# Patient Record
Sex: Male | Born: 1965 | Race: White | Hispanic: No | Smoking: Current every day smoker
Health system: Southern US, Community
[De-identification: ages and names within clinical notes are randomized; demographics above are authoritative.]

## PROBLEM LIST (undated history)

## (undated) DIAGNOSIS — K589 Irritable bowel syndrome without diarrhea: Secondary | ICD-10-CM

## (undated) DIAGNOSIS — K52839 Microscopic colitis, unspecified: Secondary | ICD-10-CM

## (undated) HISTORY — PX: SIGMOIDOSCOPY: SUR1295

## (undated) HISTORY — DX: Microscopic colitis, unspecified: K52.839

## (undated) HISTORY — PX: MOUTH SURGERY: SHX715

## (undated) HISTORY — DX: Irritable bowel syndrome, unspecified: K58.9

---

## 2000-12-06 ENCOUNTER — Ambulatory Visit (HOSPITAL_COMMUNITY): Admission: RE | Admit: 2000-12-06 | Discharge: 2000-12-06 | Payer: Self-pay | Admitting: Orthopaedic Surgery

## 2001-06-03 ENCOUNTER — Emergency Department (HOSPITAL_COMMUNITY): Admission: EM | Admit: 2001-06-03 | Discharge: 2001-06-03 | Payer: Self-pay | Admitting: Emergency Medicine

## 2001-06-04 ENCOUNTER — Encounter: Payer: Self-pay | Admitting: Emergency Medicine

## 2001-06-04 ENCOUNTER — Emergency Department (HOSPITAL_COMMUNITY): Admission: EM | Admit: 2001-06-04 | Discharge: 2001-06-04 | Payer: Self-pay | Admitting: Emergency Medicine

## 2001-06-25 ENCOUNTER — Inpatient Hospital Stay (HOSPITAL_COMMUNITY): Admission: EM | Admit: 2001-06-25 | Discharge: 2001-07-03 | Payer: Self-pay | Admitting: *Deleted

## 2003-07-27 ENCOUNTER — Emergency Department (HOSPITAL_COMMUNITY): Admission: EM | Admit: 2003-07-27 | Discharge: 2003-07-28 | Payer: Self-pay | Admitting: Emergency Medicine

## 2003-08-16 ENCOUNTER — Emergency Department (HOSPITAL_COMMUNITY): Admission: EM | Admit: 2003-08-16 | Discharge: 2003-08-16 | Payer: Self-pay | Admitting: Emergency Medicine

## 2003-08-23 ENCOUNTER — Emergency Department (HOSPITAL_COMMUNITY): Admission: EM | Admit: 2003-08-23 | Discharge: 2003-08-23 | Payer: Self-pay | Admitting: Emergency Medicine

## 2003-09-11 ENCOUNTER — Emergency Department (HOSPITAL_COMMUNITY): Admission: EM | Admit: 2003-09-11 | Discharge: 2003-09-11 | Payer: Self-pay | Admitting: Emergency Medicine

## 2003-09-16 ENCOUNTER — Emergency Department (HOSPITAL_COMMUNITY): Admission: EM | Admit: 2003-09-16 | Discharge: 2003-09-16 | Payer: Self-pay | Admitting: Emergency Medicine

## 2003-09-30 ENCOUNTER — Emergency Department (HOSPITAL_COMMUNITY): Admission: EM | Admit: 2003-09-30 | Discharge: 2003-09-30 | Payer: Self-pay | Admitting: Emergency Medicine

## 2003-10-01 ENCOUNTER — Emergency Department (HOSPITAL_COMMUNITY): Admission: EM | Admit: 2003-10-01 | Discharge: 2003-10-01 | Payer: Self-pay | Admitting: Emergency Medicine

## 2004-12-20 ENCOUNTER — Emergency Department (HOSPITAL_COMMUNITY): Admission: EM | Admit: 2004-12-20 | Discharge: 2004-12-20 | Payer: Self-pay | Admitting: Emergency Medicine

## 2004-12-25 ENCOUNTER — Emergency Department (HOSPITAL_COMMUNITY): Admission: EM | Admit: 2004-12-25 | Discharge: 2004-12-25 | Payer: Self-pay | Admitting: Emergency Medicine

## 2005-01-01 ENCOUNTER — Emergency Department (HOSPITAL_COMMUNITY): Admission: EM | Admit: 2005-01-01 | Discharge: 2005-01-01 | Payer: Self-pay | Admitting: Emergency Medicine

## 2005-03-20 ENCOUNTER — Emergency Department (HOSPITAL_COMMUNITY): Admission: EM | Admit: 2005-03-20 | Discharge: 2005-03-20 | Payer: Self-pay | Admitting: Emergency Medicine

## 2005-04-08 ENCOUNTER — Emergency Department (HOSPITAL_COMMUNITY): Admission: EM | Admit: 2005-04-08 | Discharge: 2005-04-08 | Payer: Self-pay | Admitting: Family Medicine

## 2005-07-09 ENCOUNTER — Emergency Department (HOSPITAL_COMMUNITY): Admission: EM | Admit: 2005-07-09 | Discharge: 2005-07-09 | Payer: Self-pay | Admitting: Family Medicine

## 2005-08-11 ENCOUNTER — Emergency Department (HOSPITAL_COMMUNITY): Admission: EM | Admit: 2005-08-11 | Discharge: 2005-08-11 | Payer: Self-pay | Admitting: Family Medicine

## 2005-08-12 ENCOUNTER — Emergency Department (HOSPITAL_COMMUNITY): Admission: EM | Admit: 2005-08-12 | Discharge: 2005-08-13 | Payer: Self-pay | Admitting: Emergency Medicine

## 2005-08-17 ENCOUNTER — Emergency Department (HOSPITAL_COMMUNITY): Admission: EM | Admit: 2005-08-17 | Discharge: 2005-08-17 | Payer: Self-pay | Admitting: Emergency Medicine

## 2005-09-26 ENCOUNTER — Emergency Department (HOSPITAL_COMMUNITY): Admission: EM | Admit: 2005-09-26 | Discharge: 2005-09-26 | Payer: Self-pay | Admitting: Emergency Medicine

## 2005-11-15 ENCOUNTER — Emergency Department (HOSPITAL_COMMUNITY): Admission: EM | Admit: 2005-11-15 | Discharge: 2005-11-15 | Payer: Self-pay | Admitting: Family Medicine

## 2005-12-18 ENCOUNTER — Emergency Department (HOSPITAL_COMMUNITY): Admission: EM | Admit: 2005-12-18 | Discharge: 2005-12-18 | Payer: Self-pay | Admitting: Family Medicine

## 2006-01-17 ENCOUNTER — Emergency Department (HOSPITAL_COMMUNITY): Admission: EM | Admit: 2006-01-17 | Discharge: 2006-01-17 | Payer: Self-pay | Admitting: Emergency Medicine

## 2006-01-22 ENCOUNTER — Emergency Department (HOSPITAL_COMMUNITY): Admission: EM | Admit: 2006-01-22 | Discharge: 2006-01-22 | Payer: Self-pay | Admitting: Emergency Medicine

## 2006-02-12 ENCOUNTER — Emergency Department (HOSPITAL_COMMUNITY): Admission: EM | Admit: 2006-02-12 | Discharge: 2006-02-12 | Payer: Self-pay | Admitting: Emergency Medicine

## 2006-02-14 ENCOUNTER — Emergency Department (HOSPITAL_COMMUNITY): Admission: EM | Admit: 2006-02-14 | Discharge: 2006-02-14 | Payer: Self-pay | Admitting: Emergency Medicine

## 2006-02-18 ENCOUNTER — Inpatient Hospital Stay (HOSPITAL_COMMUNITY): Admission: AD | Admit: 2006-02-18 | Discharge: 2006-02-20 | Payer: Self-pay | Admitting: Psychiatry

## 2006-02-18 ENCOUNTER — Emergency Department (HOSPITAL_COMMUNITY): Admission: EM | Admit: 2006-02-18 | Discharge: 2006-02-18 | Payer: Self-pay | Admitting: Emergency Medicine

## 2006-02-18 ENCOUNTER — Ambulatory Visit: Payer: Self-pay | Admitting: Psychiatry

## 2006-02-20 ENCOUNTER — Emergency Department (HOSPITAL_COMMUNITY): Admission: EM | Admit: 2006-02-20 | Discharge: 2006-02-20 | Payer: Self-pay | Admitting: Emergency Medicine

## 2006-02-20 ENCOUNTER — Inpatient Hospital Stay (HOSPITAL_COMMUNITY): Admission: EM | Admit: 2006-02-20 | Discharge: 2006-02-21 | Payer: Self-pay | Admitting: *Deleted

## 2006-02-23 ENCOUNTER — Emergency Department (HOSPITAL_COMMUNITY): Admission: EM | Admit: 2006-02-23 | Discharge: 2006-02-23 | Payer: Self-pay | Admitting: Emergency Medicine

## 2006-02-24 ENCOUNTER — Other Ambulatory Visit (HOSPITAL_COMMUNITY): Admission: RE | Admit: 2006-02-24 | Discharge: 2006-05-25 | Payer: Self-pay | Admitting: Psychiatry

## 2006-04-09 ENCOUNTER — Emergency Department (HOSPITAL_COMMUNITY): Admission: EM | Admit: 2006-04-09 | Discharge: 2006-04-09 | Payer: Self-pay | Admitting: Emergency Medicine

## 2006-04-15 ENCOUNTER — Emergency Department (HOSPITAL_COMMUNITY): Admission: EM | Admit: 2006-04-15 | Discharge: 2006-04-15 | Payer: Self-pay | Admitting: Emergency Medicine

## 2006-04-24 ENCOUNTER — Emergency Department (HOSPITAL_COMMUNITY): Admission: EM | Admit: 2006-04-24 | Discharge: 2006-04-24 | Payer: Self-pay | Admitting: *Deleted

## 2006-06-09 ENCOUNTER — Emergency Department (HOSPITAL_COMMUNITY): Admission: EM | Admit: 2006-06-09 | Discharge: 2006-06-09 | Payer: Self-pay | Admitting: Family Medicine

## 2006-10-24 ENCOUNTER — Emergency Department (HOSPITAL_COMMUNITY): Admission: EM | Admit: 2006-10-24 | Discharge: 2006-10-24 | Payer: Self-pay | Admitting: Emergency Medicine

## 2006-11-21 ENCOUNTER — Emergency Department (HOSPITAL_COMMUNITY): Admission: EM | Admit: 2006-11-21 | Discharge: 2006-11-21 | Payer: Self-pay | Admitting: Emergency Medicine

## 2007-12-13 ENCOUNTER — Emergency Department (HOSPITAL_COMMUNITY): Admission: EM | Admit: 2007-12-13 | Discharge: 2007-12-13 | Payer: Self-pay | Admitting: Emergency Medicine

## 2008-02-07 ENCOUNTER — Emergency Department (HOSPITAL_COMMUNITY): Admission: EM | Admit: 2008-02-07 | Discharge: 2008-02-07 | Payer: Self-pay | Admitting: Emergency Medicine

## 2008-03-02 ENCOUNTER — Emergency Department (HOSPITAL_COMMUNITY): Admission: EM | Admit: 2008-03-02 | Discharge: 2008-03-02 | Payer: Self-pay | Admitting: Emergency Medicine

## 2008-04-09 ENCOUNTER — Emergency Department (HOSPITAL_COMMUNITY): Admission: EM | Admit: 2008-04-09 | Discharge: 2008-04-09 | Payer: Self-pay | Admitting: Emergency Medicine

## 2009-12-02 ENCOUNTER — Emergency Department (HOSPITAL_COMMUNITY)
Admission: EM | Admit: 2009-12-02 | Discharge: 2009-12-02 | Payer: Self-pay | Source: Home / Self Care | Admitting: Emergency Medicine

## 2010-05-11 LAB — HIV ANTIBODY (ROUTINE TESTING W REFLEX): HIV: NONREACTIVE

## 2010-05-12 LAB — BASIC METABOLIC PANEL
BUN: 7 mg/dL (ref 6–23)
CO2: 25 mEq/L (ref 19–32)
Chloride: 108 mEq/L (ref 96–112)
GFR calc Af Amer: >60 mL/min (ref >60)
GFR calc non Af Amer: >60 mL/min (ref >60)
Glucose, Bld: 102 mg/dL — ABNORMAL HIGH (ref 70–99)
Potassium: 4.5 mEq/L (ref 3.5–5.1)
Sodium: 140 mEq/L (ref 135–145)

## 2010-05-12 LAB — HEMOCCULT GUIAC POC 1CARD (OFFICE): Fecal Occult Bld: NEGATIVE

## 2010-05-12 LAB — CBC
MCV: 96.1 fL (ref 78.0–100.0)
Platelets: 308 10*3/uL (ref 150–400)

## 2010-05-12 LAB — DIFFERENTIAL
Monocytes Absolute: 0.5 10*3/uL (ref 0.1–1.0)
Monocytes Relative: 8 % (ref 3–12)
Neutro Abs: 4 10*3/uL (ref 1.7–7.7)

## 2010-06-12 NOTE — Discharge Summary (Signed)
[facility]  Patient:    Marvin [name], Marvin [name] Visit Number: [id number] MRN: [id number]          Service Type: PSY Location: [id number] [id number] Attending Physician:  Rachael Fee Dictated by:   Reymundo Poll Dub Mikes, M.D. Admit Date:  06/25/2001 Discharge Date: 07/03/2001                             Discharge Summary  CHIEF COMPLAINT AND PRESENTING ILLNESS:  This was the first admission to [facility] for this 45 year old male, voluntarily admitted for opiate detox.  History of Vicodin dependence, using Vicodin for the past 3-1/2 years, taking up to 30 a day for a year.  Reports this time he ran out of options.  Says he has been using as many as 10 doctors at a time to obtain prescriptions.  Reports he initially started Vicodin for a back injury, reports a herniated disk.  He states he is highly motivated and states he will attend NA meetings after discharge.  His sleep has been fair, his appetite has been decreased with a 10-15 pound weight loss.  His last opiate use was Friday.  Reports nausea, poor sleep, and anxiety.  Drank over the weekend when he ran out of his medication.  Denied any psychosis, suicidal or homicidal ideas.  PAST PSYCHIATRIC HISTORY:  No previous treatment.  ALCOHOL AND DRUG HISTORY:  History of alcohol abuse, sober for 1-1/2 years until he relapsed this past weekend.  Has been using up to 30 Vicodin per day.  MEDICAL HISTORY:  Noncontributory.  PHYSICAL EXAMINATION:  Performed at [facility], no any acute findings.  MENTAL STATUS EXAMINATION:  Reveals a well-nourished, well-developed, alert, cooperative male, casually dressed.  Speech is clear, mood is anxious, affect is mildly anxious, thought processes are coherent, no evidence of psychosis, no auditory or visual hallucinations, no suicidal or homicidal ideas. Cognition well preserved.  ADMITTING  DIAGNOSES: Axis I:    1. Opiate dependence.            2. Anxiety  disorder not otherwise specified. Axis II:   No diagnosis. Axis III:  No diagnosis. Axis IV:   Moderate. Axis V:    Global assessment of function upon admission 30-35, highest            global assessment of function in past year 65-70.  COURSE IN THE HOSPITAL:  He was admitted and started on intensive individual and group psychotherapy.  We used the [employer] for detox.  He complained of extreme anxiety as well as depression.  He complained of no energy, no motivation, feeling completely overwhelmed.  He was given some Seroquel as well as some Vistaril.  He was started on Effexor 37.5 that was increased up to twice a day, and he was given Provigil.  Continued to experience a lot of anxiety, was using a lot of Seroquel, was medication seeking, complained of discomfort.  Anxiety, agitation, and afraid of what was going to happen, of going crazy, and the cramping, wanting some help with cramps and he was given Flexeril successfully.  Was using up to Seroquel 200 4 times a day to get him calm.  As he improved, he was less med seeking.  The Effexor was increased. We worked on Pharmacologist and relapse prevention.  He admitted that his appetite was starting to come back, very anxious, upset, he felt agitated, especially in the morning, but able  to use his coping skills.  The staff worked particularly hard to help him deal with coping skills, how to suit himself.  Plan was for him to be discharged, but by June 7 he felt he was not ready.  When he continued to be detoxed, the Seroquel dose was too high, so we went ahead and started decreasing the Seroquel.  By June 9, he was much improved.  He felt he was ready to go, had gained some weight, fully detoxed, mood euthymic, affect was broad.  No suicidal ideas, no homicidal ideas.  He was motivated to pursue treatment through the [facility].  DISCHARGE  DIAGNOSES: Axis I:    1. Opiate dependence.            2. Anxiety disorder not otherwise  specified.            3. Depressive disorder not otherwise specified. Axis II:   No diagnosis. Axis III:  No diagnosis. Axis IV:   Moderate. Axis V:    Global assessment of function upon discharge 60.  DISCHARGE MEDICATIONS: 1. Seroquel 100 1 every 6-8 hours as needed for anxiety. 2. Effexor XR 112.5 mg daily.  DISPOSITION:  [facility] starting the following day. Dictated by:   Reymundo Poll Dub Mikes, M.D. Attending Physician:  Rachael Fee DD:  08/02/01 TD:  08/05/01 Job: [id number]

## 2010-06-12 NOTE — Discharge Summary (Signed)
NAME:  Marvin [name], Marvin [name] NO.:  [id number]   MEDICAL RECORD NO.:  [id number]           PATIENT TYPE:   LOCATION:                                 FACILITY:   PHYSICIAN:  Geoffery Lyons, M.D.           DATE OF BIRTH:   DATE OF ADMISSION:  02/20/2006  DATE OF DISCHARGE:  02/21/2006                               DISCHARGE SUMMARY   IDENTIFYING INFORMATION:  This is a 45 year old white male who is  married.  This is a voluntary admission.   HISTORY OF PRESENT ILLNESS:  This patient was initially admitted to  [facility] on January 25 after presenting in  the emergency room; feeling anxious after running out of his Percocet  that he takes for back pain and he had started drinking.  His urine drug  screen at that time was positive for opiates and the alcohol level was  less than 5.  He was admitted to the inpatient unit and then in about 24  hours signed papers discharging himself AMA which he states was because  he wanted to go out and smoke which is not currently allowed on our unit  and he subsequently presented in the emergency room requesting admission  again and was admitted on the evening of the 27th at about 11 o'clock.  This morning, now on the 28th, he states that he had started drinking  after running out of his opiates on Saturday; was drinking to try to  stay calm.  He denies that he is dependent on alcohol; wants to pursue  sobriety as an outpatient and is requesting to attend the chemical  dependency outpatient clinic down stairs and denies any suicidal or  homicidal thought.  He is currently married, living at home with his  wife in a stable relationship.   PAST PSYCHIATRIC HISTORY:  Third inpatient psychiatric admission with  his prior stay being June 1 to July 03, 2001, at which time he was  treated for depression and opiate dependence.  Denies any prior suicide  attempts.  Has a history of abusing Vicodin in the past.  Has  admitted  using multiple physicians to obtain prescriptions.  In the past, used up  to 30 Vicodin daily.   SOCIAL HISTORY:  A history of alcohol abuse.  The patient had been sober  for about 1-1/2 years at his longest period of duration.  He has one  child and has a basic education.  No current legal problems.   CURRENT MEDICAL PROBLEMS:  None.  The patient does have some history of  a chronic back pain in the past which is how he started the use of  opiates.  No current medical problems.   DRUG ALLERGIES:  AMPICILLIN WHICH CAUSES A RASH.   CURRENT MEDICATIONS:  No current medications prescribed.   PHYSICAL FINDINGS:  Full physical exam was done in the emergency room,  noted in the record and is completely unremarkable.  Patient has a  normal neuro and motor exam today.  Vital signs on admission were all  within normal  limits.   DIAGNOSTIC STUDIES:  WBC 6.8, hemoglobin 13.5, hematocrit 39.7,  platelets 396,000, MCV 98.3.  Urine drug screen on the 25th at initial  admission was positive for opiates and on the 27th positive for  benzodiazepines, presumably from the benzodiazepines he was given on the  first admission.  Chemistry:  Sodium 144, potassium 3.9, chloride 112,  carbon dioxide 26,  BUN 12, creatinine 0.69 and random glucose was 108.  Liver enzymes were all within normal limits.  Alcohol level was less  than 5.   MENTAL STATUS EXAM:  Fully alert male, pleasant, cooperative, calm,  composed.  Speech is articulate and fluent, normal production.  Mood is  euthymic.  Thought process clear, logical and goal-directed.  He would  like to pursue outpatient chemical dependency clinic or locate a program  of his choice.  Denying any suicidal or homicidal thoughts.  Requesting  to be discharged.  No evidence of dangerous ideations.  Cognition is  intact.  Vital signs are normal.  No signs of withdrawal.  Impulse  control and judgment are within normal limits.  Calculation and   concentration intact.   AXIS I: Opiate dependence by history.  AXIS II: Deferred.  AXIS III: No diagnosis.  AXIS IV: Deferred.  AXIS V: Current 57; past year 37.   PLAN:  Discharge the patient today; and have given him some information  about the Chemical Dependency Outpatient Clinic and he will make his own  arrangements.  No medications are required or were started here on the  unit.  He will go ahead and follow up with his own primary physician and  the patient is in agreement with the plan.  He is discharged today.      Margaret A. Scott, N.P.      Geoffery Lyons, M.D.  Electronically Signed    MAS/MEDQ  D:  02/21/2006  T:  02/21/2006  Job:  [id number]

## 2010-06-12 NOTE — H&P (Signed)
[facility]  Patient:    Marvin [name], Marvin [name] Visit Number: [id number] MRN: [id number]          Service Type: EMS Location: ED Attending Physician:  Sandi Raveling Dictated by:   Candi Leash. Orsini, N.P. Admit Date:  06/25/2001 Discharge Date: 06/25/2001                     Psychiatric Admission Assessment  IDENTIFYING INFORMATION:  This is a 45 year old divorced white male voluntarily admitted on June 25, 2001 for opiate detox.  HISTORY OF PRESENT ILLNESS:  The patient presents with history of Vicodin dependence, using Vicodin for the past 3-1/2 years, taking up to 30 a day for a year.  The patient reports "its time he ran out of options."  States he has been using as many as ten doctors at a time to obtain prescriptions.  He reports he initially started Vicodin with a back injury and reports a herniated disk.  He states he is not highly motivated and states he will attend NA meetings after discharge.  His sleep has been fair.  His appetite has been decreased with a 10-15 pound weight loss.  His last opiate use was on Friday.  He reports nausea, cold sweats and anxiety.  He states he drank over the weekend when he ran out of his medications.  The patient denies any psychosis, suicidal or homicidal ideation.  PAST PSYCHIATRIC HISTORY:  First hospitalization to [facility]. No outpatient treatment.  No history of detox prior.  SOCIAL HISTORY:  This is a 45 year old divorced white male, has a 45 year old child.  He lives with his girlfriend.  He is unemployed.  No legal charges. He has a 12th grade education.  FAMILY HISTORY:  None.  ALCOHOL/DRUG HISTORY:  The patient smokes.  He has a history of alcohol abuse. He has been sober for 1-1/2 years until he relapsed this past weekend.  He has been using up to 30 Vicodin per day for the past 3-1/2 years.  His last use was on Friday.  Denies any other substance abuse.  PRIMARY CARE  Anely Spiewak:  None.  MEDICAL PROBLEMS:  None.  MEDICATIONS:  Vicodin.  DRUG ALLERGIES:  None.  PHYSICAL EXAMINATION:  Performed at [facility].  LABORATORY DATA:  Urine drug screen was positive for opiates.  BUN was elevated at 24.  CBC was within normal limits.  MENTAL STATUS EXAMINATION:  He is an alert, young adult, casually dressed, cooperative, Caucasian male.  Speech is clear.  Mood is anxious.  Affect is mildly anxious.  Thought processes are coherent.  No evidence of psychosis. No auditory or visual hallucinations.  No suicidal or homicidal ideation. Cognitive function intact.  Memory is fair.  Judgment is poor.  Insight is fair.  DIAGNOSES: Axis I:    1. Opiate dependence.            2. Depression not otherwise specified. Axis II:   Deferred. Axis III:  None. Axis IV:   Deferred. Axis V:    Current 40; estimated this past year 80.  PLAN:  Voluntary admission for opiate dependence.  Contract for safety.  Check every 15 minutes.  Will initiate the Wytensin protocol.  Continue with Effexor.  Encourage fluids.  Have Seroquel for agitation.  Increase coping skills.  To follow up with NA and [facility].  TENTATIVE LENGTH OF STAY:  Three to five days. Dictated by:   Candi Leash. Orsini, N.P. Attending Physician:  Sandi Raveling DD:  06/27/01 TD:  06/28/01 Job: [id number]

## 2010-10-19 ENCOUNTER — Ambulatory Visit (INDEPENDENT_AMBULATORY_CARE_PROVIDER_SITE_OTHER): Payer: Self-pay

## 2010-10-19 DIAGNOSIS — R197 Diarrhea, unspecified: Secondary | ICD-10-CM

## 2010-10-27 LAB — DIFFERENTIAL
Basophils Relative: 1
Eosinophils Relative: 2
Monocytes Absolute: 0.5
Monocytes Relative: 8
Neutro Abs: 3.8

## 2010-10-27 LAB — URINE CULTURE: Colony Count: NO GROWTH

## 2010-10-27 LAB — URINE MICROSCOPIC-ADD ON

## 2010-10-27 LAB — URINALYSIS, ROUTINE W REFLEX MICROSCOPIC
Bilirubin Urine: NEGATIVE
Nitrite: NEGATIVE
Urobilinogen, UA: 0.2

## 2010-10-27 LAB — CBC
Platelets: 336
RBC: 4.96

## 2010-10-27 LAB — OCCULT BLOOD X 1 CARD TO LAB, STOOL: Fecal Occult Bld: NEGATIVE

## 2010-11-06 ENCOUNTER — Ambulatory Visit (AMBULATORY_SURGERY_CENTER): Payer: Self-pay | Admitting: Gastroenterology

## 2010-11-06 ENCOUNTER — Encounter: Payer: Self-pay | Admitting: Gastroenterology

## 2010-11-06 VITALS — Temp 97.6°F | Ht 70.0 in | Wt 145.0 lb

## 2010-11-06 DIAGNOSIS — K644 Residual hemorrhoidal skin tags: Secondary | ICD-10-CM

## 2010-11-06 DIAGNOSIS — K589 Irritable bowel syndrome without diarrhea: Secondary | ICD-10-CM

## 2010-11-06 MED ORDER — HYDROCORTISONE ACETATE 25 MG RE SUPP: 25.0000 mg | Freq: Two times a day (BID) | RECTAL | Status: DC

## 2010-11-06 MED ORDER — HYDROXYZINE HCL 10 MG PO TABS: 10.0000 mg | ORAL_TABLET | Freq: Three times a day (TID) | ORAL | Status: AC | PRN

## 2010-11-06 MED ORDER — SODIUM CHLORIDE 0.9 % IV SOLN: 500.0000 mL | INTRAVENOUS | Status: DC

## 2010-11-06 MED ORDER — HYDROCORTISONE 2.5 % RE CREA: TOPICAL_CREAM | RECTAL | Status: DC

## 2010-11-06 NOTE — Patient Instructions (Signed)
Please refer to your blue and neon green sheets for instructions regarding diet and activity for the rest of today.  You may resume your medications as you would normally take them.   Hemorrhoids Hemorrhoids are dilated (enlarged) veins around the rectum. Sometimes clots will form in the veins. This makes them swollen and painful. These are called thrombosed hemorrhoids. Causes of hemorrhoids include:  Pregnancy: this increases the pressure in the hemorrhoidal veins.   Constipation.   Straining to have a bowel movement.  HOME CARE INSTRUCTIONS  Eat a well balanced diet and drink 6 to 8 glasses of water every day to avoid constipation. You may also use a bulk laxative.   Avoid straining to have bowel movements.   Keep anal area dry and clean.   Only take over-the-counter or prescription medicines for pain, discomfort, or fever as directed by your caregiver.  If thrombosed:  Take hot sitz baths for 20 to 30 minutes, 3 to 4 times per day.   If the hemorrhoids are very tender and swollen, place ice packs on area as tolerated. Using ice packs between sitz baths may be helpful. Fill a plastic bag with ice and use a towel between the bag of ice and your skin.   Special creams and suppositories (Anusol, Nupercainal, Wyanoids) may be used or applied as directed.   Do not use a donut shaped pillow or sit on the toilet for long periods. This increases blood pooling and pain.   Move your bowels when your body has the urge; this will require less straining and will decrease pain and pressure.   Only take over-the-counter or prescription medicines for pain, discomfort, or fever as directed by your caregiver.  SEEK MEDICAL CARE IF:  You have increasing pain and swelling that is not controlled with your prescription.   You have uncontrolled bleeding.   You have an inability or difficulty having a bowel movement.   You have pain or inflammation outside the area of the hemorrhoids.   You  have chills and/or an increased oral temperature above that lasts for 2 days or longer, or as your caregiver suggests.  MAKE SURE YOU:   Understand these instructions.   Will watch your condition.   Will get help right away if you are not doing well or get worse.  Document Released: 01/09/2000 Document Re-Released: 12/25/2007 [facility] Patient Information 2011 [place], [place].

## 2010-11-09 ENCOUNTER — Telehealth: Payer: Self-pay | Admitting: *Deleted

## 2010-11-09 ENCOUNTER — Ambulatory Visit: Payer: Self-pay

## 2010-11-09 NOTE — Telephone Encounter (Signed)

## 2010-12-02 ENCOUNTER — Other Ambulatory Visit: Payer: Self-pay | Admitting: Gastroenterology

## 2010-12-02 ENCOUNTER — Ambulatory Visit (INDEPENDENT_AMBULATORY_CARE_PROVIDER_SITE_OTHER): Payer: Self-pay

## 2010-12-02 DIAGNOSIS — R197 Diarrhea, unspecified: Secondary | ICD-10-CM

## 2010-12-02 MED ORDER — HYDROCORTISONE 2.5 % RE CREA: TOPICAL_CREAM | RECTAL | Status: DC

## 2010-12-02 MED ORDER — HYDROCORTISONE ACETATE 25 MG RE SUPP: 25.0000 mg | Freq: Two times a day (BID) | RECTAL | Status: DC

## 2011-01-05 ENCOUNTER — Ambulatory Visit (INDEPENDENT_AMBULATORY_CARE_PROVIDER_SITE_OTHER): Payer: Self-pay

## 2011-01-05 DIAGNOSIS — R197 Diarrhea, unspecified: Secondary | ICD-10-CM

## 2011-02-03 ENCOUNTER — Ambulatory Visit (INDEPENDENT_AMBULATORY_CARE_PROVIDER_SITE_OTHER): Payer: Self-pay

## 2011-02-03 DIAGNOSIS — R197 Diarrhea, unspecified: Secondary | ICD-10-CM

## 2011-03-09 ENCOUNTER — Other Ambulatory Visit: Payer: Self-pay | Admitting: Gastroenterology

## 2011-03-09 MED ORDER — DIPHENOXYLATE-ATROPINE 2.5-0.025 MG PO TABS: ORAL_TABLET | ORAL | Status: DC

## 2011-03-15 ENCOUNTER — Other Ambulatory Visit: Payer: Self-pay

## 2011-03-15 MED ORDER — DIPHENOXYLATE-ATROPINE 2.5-0.025 MG PO TABS: ORAL_TABLET | ORAL | Status: DC

## 2011-03-22 ENCOUNTER — Ambulatory Visit: Payer: Self-pay

## 2011-03-23 ENCOUNTER — Ambulatory Visit (INDEPENDENT_AMBULATORY_CARE_PROVIDER_SITE_OTHER): Payer: Self-pay

## 2011-03-23 DIAGNOSIS — R197 Diarrhea, unspecified: Secondary | ICD-10-CM

## 2011-03-30 ENCOUNTER — Encounter: Payer: Self-pay | Admitting: Gastroenterology

## 2011-03-30 ENCOUNTER — Ambulatory Visit (AMBULATORY_SURGERY_CENTER): Payer: Self-pay | Admitting: Gastroenterology

## 2011-03-30 DIAGNOSIS — Z800 Family history of malignant neoplasm of digestive organs: Secondary | ICD-10-CM

## 2011-03-30 DIAGNOSIS — K648 Other hemorrhoids: Secondary | ICD-10-CM

## 2011-03-30 DIAGNOSIS — Z86010 Personal history of colonic polyps: Secondary | ICD-10-CM

## 2011-03-30 MED ORDER — SODIUM CHLORIDE 0.9 % IV SOLN: 500.0000 mL | INTRAVENOUS | Status: DC

## 2011-03-30 NOTE — Patient Instructions (Signed)

## 2011-03-30 NOTE — Op Note (Signed)
[facility] [address] [place], [place]  [address]  COLONOSCOPY PROCEDURE REPORT  PATIENT:  Marvin [name], Marvin [name]  MR#:  [id number] BIRTHDATE:  08-06-1965, 45 yrs. old  GENDER:  male ENDOSCOPIST:  Barbette Hair. Arlyce Dice, MD REF. BY: PROCEDURE DATE:  03/30/2011 PROCEDURE:  Diagnostic Colonoscopy ASA CLASS:  Class I INDICATIONS:  Research Protocol MEDICATIONS:   These medications were titrated to patient response per physician's verbal order, Fentanyl 150 mg IV, Versed 10 mg IV, Benadryl 50 mg IV  DESCRIPTION OF PROCEDURE:   After the risks benefits and alternatives of the procedure were thoroughly explained, informed consent was obtained.  Digital rectal exam was performed and revealed prolasped hemorrhoids.   The LB 180AL [id number] endoscope was introduced through the anus and advanced to the ileum, without limitations.  The quality of the prep was good, using MoviPrep. The instrument was then slowly withdrawn as the colon was fully examined. <<PROCEDUREIMAGES>>  FINDINGS:  Internal Hemorrhoids were found (see image4).  This was otherwise a normal examination of the colon (see image1, image2, and image3).   Retroflexed views in the rectum revealed no abnormalities.    The time to cecum =  1) 5.75  minutes. The scope was then withdrawn in  1) 6.25  minutes from the cecum and the procedure completed. COMPLICATIONS:  None ENDOSCOPIC IMPRESSION: 1) Internal hemorrhoids 2) Otherwise normal examination RECOMMENDATIONS:Per protocol Band ligation of hemorrhoids  REPEAT EXAM:  In 10 year(s) for Colonoscopy.  ______________________________ Barbette Hair. Arlyce Dice, MD  CC:  n. eSIGNED:   Barbette Hair. Treasa Bradshaw at 03/30/2011 09:04 AM  Cari Caraway, [phone]

## 2011-03-30 NOTE — Progress Notes (Signed)
Patient did not experience any of the following events: a burn prior to discharge; a fall within the facility; wrong site/side/patient/procedure/implant event; or a hospital transfer or hospital admission upon discharge from the facility. (G8907) Patient did not have preoperative order for IV antibiotic SSI prophylaxis. (G8918)  

## 2011-03-31 ENCOUNTER — Telehealth: Payer: Self-pay | Admitting: *Deleted

## 2011-03-31 NOTE — Telephone Encounter (Signed)
Left mesasge on number given in admitting yesterday. ewm

## 2011-04-07 ENCOUNTER — Telehealth: Payer: Self-pay

## 2011-04-07 NOTE — Telephone Encounter (Signed)
Called pt and discussed that Dr. Arlyce Dice recommended that he have banding of his hemorrhoids. Pt states he does not have insurance and his other procedure was covered by research. Pt would be interested in having this done if he had insurance or could set up payment plans. Dr. Arlyce Dice please advise.

## 2011-04-07 NOTE — Telephone Encounter (Signed)
Just set up payments

## 2011-04-08 ENCOUNTER — Other Ambulatory Visit: Payer: Self-pay | Admitting: Gastroenterology

## 2011-04-08 ENCOUNTER — Ambulatory Visit: Payer: Self-pay

## 2011-04-08 NOTE — Telephone Encounter (Signed)
Pt scheduled for flex-sig with banding 04/21/11@12 :30pm at [facility]. Prep instructions mailed to pt. Pt to call [phone] to set up payments. Pt aware of appt date and time.

## 2011-04-20 ENCOUNTER — Ambulatory Visit (INDEPENDENT_AMBULATORY_CARE_PROVIDER_SITE_OTHER): Payer: Self-pay

## 2011-04-20 DIAGNOSIS — R197 Diarrhea, unspecified: Secondary | ICD-10-CM

## 2011-04-21 ENCOUNTER — Encounter (HOSPITAL_COMMUNITY)
Admission: RE | Disposition: A | Discharge status: Home / Self Care | Payer: Self-pay | Source: Ambulatory Visit | Attending: Gastroenterology

## 2011-04-21 ENCOUNTER — Ambulatory Visit (HOSPITAL_COMMUNITY)
Admission: RE | Admit: 2011-04-21 | Discharge: 2011-04-21 | Disposition: A | Discharge status: Home / Self Care | Payer: Self-pay | Source: Ambulatory Visit | Attending: Gastroenterology | Admitting: Gastroenterology

## 2011-04-21 ENCOUNTER — Encounter (HOSPITAL_COMMUNITY): Payer: Self-pay

## 2011-04-21 DIAGNOSIS — K589 Irritable bowel syndrome without diarrhea: Secondary | ICD-10-CM

## 2011-04-21 DIAGNOSIS — K648 Other hemorrhoids: Secondary | ICD-10-CM

## 2011-04-21 DIAGNOSIS — F17200 Nicotine dependence, unspecified, uncomplicated: Secondary | ICD-10-CM | POA: Insufficient documentation

## 2011-04-21 DIAGNOSIS — K602 Anal fissure, unspecified: Secondary | ICD-10-CM | POA: Insufficient documentation

## 2011-04-21 HISTORY — PX: FLEXIBLE SIGMOIDOSCOPY: SHX5431

## 2011-04-21 SURGERY — SIGMOIDOSCOPY, FLEXIBLE
Anesthesia: Moderate Sedation

## 2011-04-21 MED ORDER — FENTANYL CITRATE 0.05 MG/ML IJ SOLN
INTRAMUSCULAR | Status: AC
  Filled 2011-04-21: qty 4

## 2011-04-21 MED ORDER — FENTANYL CITRATE 0.05 MG/ML IJ SOLN
INTRAMUSCULAR | Status: DC | PRN
  Administered 2011-04-21 (×4): 25 ug via INTRAVENOUS

## 2011-04-21 MED ORDER — ACETAMINOPHEN-CODEINE #3 300-30 MG PO TABS
2.0000 | ORAL_TABLET | Freq: Once | ORAL | Status: AC
  Administered 2011-04-21: 2 via ORAL
  Filled 2011-04-21: qty 2

## 2011-04-21 MED ORDER — MIDAZOLAM HCL 10 MG/2ML IJ SOLN
INTRAMUSCULAR | Status: DC | PRN
  Administered 2011-04-21: 2 mg via INTRAVENOUS
  Administered 2011-04-21: 1 mg via INTRAVENOUS
  Administered 2011-04-21 (×3): 2 mg via INTRAVENOUS

## 2011-04-21 MED ORDER — MIDAZOLAM HCL 10 MG/2ML IJ SOLN
INTRAMUSCULAR | Status: AC
  Filled 2011-04-21: qty 4

## 2011-04-21 MED ORDER — DIPHENHYDRAMINE HCL 50 MG/ML IJ SOLN
INTRAMUSCULAR | Status: AC
  Filled 2011-04-21: qty 1

## 2011-04-21 MED ORDER — DIPHENHYDRAMINE HCL 50 MG/ML IJ SOLN
INTRAMUSCULAR | Status: DC | PRN
  Administered 2011-04-21: 25 mg via INTRAVENOUS

## 2011-04-21 MED ORDER — DILTIAZEM GEL 2 %: 1.0000 "application " | Freq: Two times a day (BID) | CUTANEOUS | Status: DC

## 2011-04-21 NOTE — Discharge Instructions (Addendum)
Warm soaks daily    Endoscopy Care After Please read the instructions outlined below and refer to this sheet in the next few weeks. These discharge instructions provide you with general information on caring for yourself after you leave the hospital. Your doctor may also give you specific instructions. While your treatment has been planned according to the most current medical practices available, unavoidable complications occasionally occur. If you have any problems or questions after discharge, please call your doctor. HOME CARE INSTRUCTIONS Activity  You may resume your regular activity but move at a slower pace for the next 24 hours.   Take frequent rest periods for the next 24 hours.   Walking will help expel (get rid of) the air and reduce the bloated feeling in your abdomen.   No driving for 24 hours (because of the anesthesia (medicine) used during the test).   You may shower.   Do not sign any important legal documents or operate any machinery for 24 hours (because of the anesthesia used during the test).  Nutrition  Drink plenty of fluids.   You may resume your normal diet.   Begin with a light meal and progress to your normal diet.   Avoid alcoholic beverages for 24 hours or as instructed by your caregiver.  Medications You may resume your normal medications unless your caregiver tells you otherwise. What you can expect today  You may experience abdominal discomfort such as a feeling of fullness or "gas" pains.   You may experience a sore throat for 2 to 3 days. This is normal. Gargling with salt water may help this.  Follow-up Your doctor will discuss the results of your test with you. SEEK IMMEDIATE MEDICAL CARE IF:  You have excessive nausea (feeling sick to your stomach) and/or vomiting.   You have severe abdominal pain and distention (swelling).   You have trouble swallowing.   You have a temperature over 100 F (37.8 C).   You have rectal bleeding or  vomiting of blood.  Document Released: 08/26/2003 Document Revised: 12/31/2010 Document Reviewed: 03/08/2007 [facility] Patient Information 2012 [place], [place].

## 2011-04-21 NOTE — Op Note (Signed)
[facility] [address] [place], [place]  [address]  FLEXIBLE SIGMOIDOSCOPY PROCEDURE REPORT  PATIENT:  Marvin [name], Marvin [name]  MR#:  [id number] BIRTHDATE:  [birth year], 45 yrs. old  GENDER:  male  ENDOSCOPIST:  Barbette Hair. Arlyce Dice, MD Referred by:  PROCEDURE DATE:  04/21/2011 PROCEDURE:  Flexible Sigmoidoscopy with banding ASA CLASS:  Class I INDICATIONS:  treatment of hemorrhoids  MEDICATIONS:   These medications were titrated to patient response per physician's verbal order, Fentanyl 100 mcg IV, Versed 9 mg IV, Benadryl 25 mg IV  DESCRIPTION OF PROCEDURE:   After the risks benefits and alternatives of the procedure were thoroughly explained, informed consent was obtained.  Digital rectal exam was performed and revealed perianal skin tags, sentinel pile and fissure.  there is a fissure at the posterior area at 11:00 The [id number] ([id number]) endoscope was introduced through the anus and advanced to the sigmoid colon, without limitations.  The quality of the prep was . The instrument was then slowly withdrawn as the mucosa was fully examined.  Internal hemorrhoids were found. 4 bands were placed just above the dentate line overlying the 3 internal hemorrhoid bundles Retroflexion was not performed.  The scope was then withdrawn from the patient and the procedure terminated.  COMPLICATIONS:  None  ENDOSCOPIC IMPRESSION: 1) Internal hemorrhoids - status post band ligation 2) Anal Fissure  RECOMMENDATIONS:Warm soaks Diltiazem ointment to fissure OV 1 month  REPEAT EXAM:  No  ______________________________ Barbette Hair. Arlyce Dice, MD  CC:  n. eSIGNED:   Barbette Hair. Tracia Lacomb at 04/21/2011 12:44 PM  Cari Caraway, [phone]

## 2011-04-21 NOTE — H&P (Signed)
  History of Present Illness:  Marvin [name] is a 46 year old white male here for band ligation of hemorrhoids. He says that Right characterized by pain and bleeding. Colonoscopy earlier this month demonstrated hemorrhoids only.    Past Medical History  Diagnosis Date  . IBS (irritable bowel syndrome)    Past Surgical History  Procedure Date  . Mouth surgery   . Sigmoidoscopy    family history is not on file. No current facility-administered medications for this encounter.   Allergies as of 04/08/2011  . (No Known Allergies)    reports that he has been smoking Cigarettes.  He has been smoking about 1.5 packs per day. He has never used smokeless tobacco. He reports that he does not drink alcohol or use illicit drugs.     Review of Systems: Pertinent positive and negative review of systems were noted in the above HPI section. All other review of systems were otherwise negative.  Vital signs were reviewed in today's medical record Physical Exam: General: Well developed , well nourished, no acute distress Head: Normocephalic and atraumatic Eyes:  sclerae anicteric, EOMI Ears: Normal auditory acuity Mouth: No deformity or lesions Neck: Supple, no masses or thyromegaly Lungs: Clear throughout to auscultation Heart: Regular rate and rhythm; no murmurs, rubs or bruits Abdomen: Soft, non tender and non distended. No masses, hepatosplenomegaly or hernias noted. Normal Bowel sounds Rectal: No external rectal abnormalities Musculoskeletal: Symmetrical with no gross deformities  Skin: No lesions on visible extremities Pulses:  Normal pulses noted Extremities: No clubbing, cyanosis, edema or deformities noted Neurological: Alert oriented x 4, grossly nonfocal Cervical Nodes:  No significant cervical adenopathy Inguinal Nodes: No significant inguinal adenopathy Psychological:  Alert and cooperative. Normal mood and affect  Impression-symptomatic internal  hemorrhoids  Recommendations #1 band ligation of hemorrhoids

## 2011-04-22 ENCOUNTER — Encounter (HOSPITAL_COMMUNITY): Payer: Self-pay | Admitting: Gastroenterology

## 2011-05-05 ENCOUNTER — Ambulatory Visit (INDEPENDENT_AMBULATORY_CARE_PROVIDER_SITE_OTHER): Payer: Self-pay

## 2011-05-05 DIAGNOSIS — R197 Diarrhea, unspecified: Secondary | ICD-10-CM

## 2011-05-26 ENCOUNTER — Ambulatory Visit: Payer: Self-pay | Admitting: Gastroenterology

## 2011-06-30 ENCOUNTER — Ambulatory Visit (INDEPENDENT_AMBULATORY_CARE_PROVIDER_SITE_OTHER): Payer: Self-pay

## 2011-06-30 DIAGNOSIS — R197 Diarrhea, unspecified: Secondary | ICD-10-CM

## 2011-07-02 ENCOUNTER — Other Ambulatory Visit: Payer: Self-pay

## 2011-07-02 ENCOUNTER — Ambulatory Visit: Payer: Self-pay

## 2011-07-02 NOTE — Telephone Encounter (Signed)
Pt needs new prescription for Lomotil 60 tabs with 5-6 refills or quantity of 120 with 3 refills. Pt wants this sent to [facility] on [address]. Dr. Arlyce Dice is this ok to send in...Marland KitchenMarland KitchenPlease advise.  Pts cell number [phone].

## 2011-07-05 MED ORDER — DIPHENOXYLATE-ATROPINE 2.5-0.025 MG PO TABS: ORAL_TABLET | ORAL | Status: DC

## 2011-07-05 NOTE — Telephone Encounter (Signed)
Rx sent to pharmacy. Pt aware.  

## 2011-07-05 NOTE — Telephone Encounter (Signed)
yes

## 2011-07-07 ENCOUNTER — Telehealth: Payer: Self-pay | Admitting: Gastroenterology

## 2011-07-07 NOTE — Telephone Encounter (Signed)
Spoke with pt and he has already picked up the rx, the pharmacy did have the medication.

## 2011-10-12 ENCOUNTER — Telehealth: Payer: Self-pay | Admitting: Gastroenterology

## 2011-10-12 NOTE — Telephone Encounter (Signed)
Pt states he is having problems with gas, bloating, and diarrhea. Pt is not very descriptive of his symptoms. States it is the worst it has ever been. Requesting to be seen as soon as possible. Pt scheduled to see Mike Gip PA tomorrow at 11am. Pt aware of appt date and time.

## 2011-10-13 ENCOUNTER — Encounter: Payer: Self-pay | Admitting: Physician Assistant

## 2011-10-13 ENCOUNTER — Ambulatory Visit (INDEPENDENT_AMBULATORY_CARE_PROVIDER_SITE_OTHER): Payer: Self-pay | Admitting: Physician Assistant

## 2011-10-13 ENCOUNTER — Other Ambulatory Visit: Payer: Self-pay | Admitting: *Deleted

## 2011-10-13 VITALS — BP 118/74 | HR 84 | Ht 70.0 in | Wt 137.4 lb

## 2011-10-13 DIAGNOSIS — K529 Noninfective gastroenteritis and colitis, unspecified: Secondary | ICD-10-CM

## 2011-10-13 DIAGNOSIS — R197 Diarrhea, unspecified: Secondary | ICD-10-CM

## 2011-10-13 DIAGNOSIS — K589 Irritable bowel syndrome without diarrhea: Secondary | ICD-10-CM

## 2011-10-13 MED ORDER — PRAMOXINE-HC 1-2.5 % EX CREA: TOPICAL_CREAM | CUTANEOUS | Status: AC

## 2011-10-13 MED ORDER — DIPHENOXYLATE-ATROPINE 2.5-0.025 MG PO TABS: ORAL_TABLET | ORAL | Status: DC

## 2011-10-13 MED ORDER — NA SULFATE-K SULFATE-MG SULF 17.5-3.13-1.6 GM/177ML PO SOLN: 1.0000 | Freq: Once | ORAL | Status: DC

## 2011-10-13 NOTE — Progress Notes (Signed)
Subjective:    Patient ID: Marvin [name], male    DOB: 1965/04/16, 46 y.o.   MRN: [id number]  HPI Maor is a 46 year old white male known to Dr. Arlyce Dice who had been seen previously as part of a drug study. He had undergone colonoscopy in March of 2013 a bleed because of IBS symptoms as are not part of our apical records and was found to have a normal exam and internal hemorrhoids. He was then set up for flexible sigmoidoscopy on 04/21/2011 and had band ligation x4 of internal hemorrhoids. Patient says that he has had problems over the past 5 years with ongoing chronic diarrhea but is not sure that it is IBS. Appendectomy is been getting prescriptions for Lomotil and generally takes 5 tablets per day which helps to control his diarrhea. He says he would have portable diarrhea if not for the Lomotil. He generally does not have any abdominal pain or cramping and no bleeding. He says he goes through "flares" where his diarrhea will be uncontrolled even with the Lomotil. He says over the past week and a half he has been having one of those episodes and is having about 5 liquid bowel movements despite taking 5 Lomotil per day. He says he also usually  loses 5-10 pounds during these episodes. He has not had any other GI workup other than what was done as part of the drug study. He has not had any recent known infectious exposures no changes in medications no recent antibiotics foreign travel etc. He says he is very rigid about his diet and has not been doing anything different that he's been aware of.    Review of Systems  Constitutional: Positive for fatigue and unexpected weight change.  HENT: Negative.   Eyes: Negative.   Respiratory: Negative.   Cardiovascular: Negative.   Gastrointestinal: Positive for diarrhea.  Genitourinary: Negative.   Musculoskeletal: Negative.   Neurological: Negative.   Hematological: Negative.   Psychiatric/Behavioral: Negative.    Outpatient Encounter  Prescriptions as of 10/13/2011  Medication Sig Dispense Refill  . diphenoxylate-atropine (LOMOTIL) 2.5-0.025 MG per tablet T1-2 tabs every 6 hours as needed  60 tablet  5  . hydrocortisone (ANUSOL-HC) 2.5 % rectal cream Apply rectally 2 times daily  30 g  1  . Na Sulfate-K Sulfate-Mg Sulf SOLN Take 1 kit by mouth once.  354 mL  0  . DISCONTD: diltiazem 2 % GEL Apply 1 application topically 2 (two) times daily.  30 g  2  . DISCONTD: hydrocortisone (ANUSOL-HC) 25 MG suppository Place 1 suppository (25 mg total) rectally every 12 (twelve) hours.  12 suppository  1    No Known Allergies Patient Active Problem List  Diagnosis  . Internal hemorrhoids without mention of complication  . Irritable bowel syndrome  . Chronic diarrhea   History   Social History  . Marital Status: Divorced    Spouse Name: N/A    Number of Children: 2  . Years of Education: N/A   Occupational History  . Not on file.   Social History Main Topics  . Smoking status: Current Every Day Smoker -- 1.5 packs/day    Types: Cigarettes  . Smokeless tobacco: Never Used   Comment: for 30 yrs  . Alcohol Use: No  . Drug Use: No  . Sexually Active: Not on file   Other Topics Concern  . Not on file   Social History Narrative  . No narrative on file       Objective:  Physical Exam well-developed thin white male in no acute distress blood pressure 118/74 pulse 84 height 5 foot 10 weight 137. HEENT; nontraumatic normocephalic EOMI PERRLA sclera anicteric,Neck; Supple no JVD, Cardiovascular; regular rate and rhythm with S1-S2 no murmur or gallop, Pulmonary; clear bilaterally, Abdomen; flat soft bowel sounds are active there is no focal tenderness no guarding or rebound no palpable mass or hepatosplenomegaly, Rectal; not done, Extremities; no clubbing cyanosis or edema skin warm and dry, Psych; somewhat anxious but mood and affect appropriate        Assessment & Plan:  #31 46 year old male with 5 year history of  chronic diarrhea requiring daily Lomotil use, periodic exacerbations associated with increased diarrhea despite Lomotil and weight loss. His symptoms may be related to irritable bowel syndrome however it is unusual lose weight with IBS. He needs workup to rule out a microscopic colitis and underlying celiac disease. Other possibilities would be small bowel bacterial overgrowth and/or superimposed infection  Plan; will check stool for culture O&P and C. Difficile Check CBC with differential CRP, sedimentation rate and CMET Refill Lomotil Patient asks for pain medication for rectal irritation due to increased diarrhea and this is declined Analpram 2.5% apply 3-4 times daily as needed for anorectal irritation Schedule for upper endoscopy with small bowel biopsies and colonoscopy with random biopsies with Dr. Arlyce Dice. Procedures were discussed in detail with patient and he is agreeable to proceed. We also discussed the trial probiotics but he states he is used him in the past and they have actually worsened his diarrhea.

## 2011-10-13 NOTE — Progress Notes (Signed)
Reviewed and agree with management. Jamilah Jean D. Osmar Howton, M.D., FACG  

## 2011-10-13 NOTE — Patient Instructions (Addendum)
Go to our lab tomorrow, basement level. We sent prescription for the Analpram [facility] Cream to [facility]. We have given you a sample of the Suprep for the colonoscopy.  We are refilling the Lomotil as well.

## 2011-10-14 ENCOUNTER — Other Ambulatory Visit (INDEPENDENT_AMBULATORY_CARE_PROVIDER_SITE_OTHER): Payer: Self-pay

## 2011-10-14 DIAGNOSIS — K589 Irritable bowel syndrome without diarrhea: Secondary | ICD-10-CM

## 2011-10-14 LAB — COMPREHENSIVE METABOLIC PANEL
ALT: 31 U/L (ref 0–53)
Albumin: 4.2 g/dL (ref 3.5–5.2)
CO2: 24 mEq/L (ref 19–32)
Calcium: 9.4 mg/dL (ref 8.4–10.5)
Chloride: 105 mEq/L (ref 96–112)
GFR: 92.3 mL/min (ref >60.00)
Glucose, Bld: 106 mg/dL — ABNORMAL HIGH (ref 70–99)
Potassium: 4.3 mEq/L (ref 3.5–5.1)
Sodium: 136 mEq/L (ref 135–145)
Total Protein: 7.2 g/dL (ref 6.0–8.3)

## 2011-10-14 LAB — CBC WITH DIFFERENTIAL/PLATELET
Basophils Absolute: 0 10*3/uL (ref 0.0–0.1)
Eosinophils Absolute: 0.1 10*3/uL (ref 0.0–0.7)
Lymphocytes Relative: 28.9 % (ref 12.0–46.0)
MCHC: 32.9 g/dL (ref 30.0–36.0)
Monocytes Relative: 8.5 % (ref 3.0–12.0)
Neutro Abs: 4 10*3/uL (ref 1.4–7.7)
Platelets: 369 10*3/uL (ref 150.0–400.0)
RDW: 13.5 % (ref 11.5–14.6)

## 2011-10-14 LAB — C-REACTIVE PROTEIN: CRP: <0.5 mg/dL (ref 0.5–20.0)

## 2011-10-14 LAB — SEDIMENTATION RATE: Sed Rate: 10 mm/hr (ref 0–22)

## 2011-10-15 ENCOUNTER — Other Ambulatory Visit: Payer: Self-pay

## 2011-10-15 DIAGNOSIS — K589 Irritable bowel syndrome without diarrhea: Secondary | ICD-10-CM

## 2011-10-15 LAB — TISSUE TRANSGLUTAMINASE, IGA: Tissue Transglutaminase Ab, IgA: 6.4 U/mL (ref <20)

## 2011-10-15 LAB — GLIADIN ANTIBODIES, SERUM: Gliadin IgA: 3.3 U/mL (ref <20)

## 2011-10-16 LAB — FECAL LACTOFERRIN, QUANT: Lactoferrin: POSITIVE

## 2011-10-19 LAB — OVA AND PARASITE SCREEN: OP: NONE SEEN

## 2011-10-19 LAB — STOOL CULTURE

## 2011-10-27 ENCOUNTER — Ambulatory Visit (AMBULATORY_SURGERY_CENTER): Payer: Self-pay | Admitting: Gastroenterology

## 2011-10-27 ENCOUNTER — Encounter: Payer: Self-pay | Admitting: Gastroenterology

## 2011-10-27 VITALS — BP 127/60 | HR 99 | Temp 97.2°F | Resp 25 | Ht 70.0 in | Wt 137.0 lb

## 2011-10-27 DIAGNOSIS — R12 Heartburn: Secondary | ICD-10-CM

## 2011-10-27 DIAGNOSIS — K5289 Other specified noninfective gastroenteritis and colitis: Secondary | ICD-10-CM

## 2011-10-27 DIAGNOSIS — R197 Diarrhea, unspecified: Secondary | ICD-10-CM

## 2011-10-27 DIAGNOSIS — K589 Irritable bowel syndrome without diarrhea: Secondary | ICD-10-CM

## 2011-10-27 MED ORDER — MESALAMINE 1.2 G PO TBEC: 2.4000 g | DELAYED_RELEASE_TABLET | Freq: Every day | ORAL | Status: DC

## 2011-10-27 MED ORDER — SODIUM CHLORIDE 0.9 % IV SOLN: 500.0000 mL | INTRAVENOUS | Status: DC

## 2011-10-27 NOTE — Op Note (Signed)
[facility] [address] [place], [address]   ENDOSCOPY PROCEDURE REPORT  PATIENT: [name], Marvin  MR#: [id number] BIRTHDATE: 09/11/1965 , 45  yrs. old GENDER: Male ENDOSCOPIST: Louis Meckel, MD REFERRED BY: PROCEDURE DATE:  10/27/2011 PROCEDURE:  EGD w/ biopsy ASA CLASS:     Class II INDICATIONS: MEDICATIONS: There was residual sedation effect present from prior procedure, MAC sedation, administered by CRNA, and propofol (Diprivan) 200mg  IV TOPICAL ANESTHETIC:  DESCRIPTION OF PROCEDURE: After the risks benefits and alternatives of the procedure were thoroughly explained, informed consent was obtained.  The LB GIF-H180 [id number] endoscope was introduced through the mouth and advanced to the third portion of the duodenum. Without limitations.  The instrument was slowly withdrawn as the mucosa was fully examined.    The upper, middle and distal third of the esophagus were carefully inspected and no abnormalities were noted.  Multiple biopsies were taken of the duodenal folds to rule out celiac The z-line was well seen at the GEJ.  The endoscope was pushed into the fundus which was normal including a retroflexed view.  The antrum, gastric body, first and second part of the duodenum were unremarkable. Retroflexed views revealed no abnormalities.     The scope was then withdrawn from the patient and the procedure completed.  COMPLICATIONS: There were no complications. ENDOSCOPIC IMPRESSION: Normal EGD  RECOMMENDATIONS: await biopsy results  REPEAT EXAM:  eSigned:  Louis Meckel, MD 10/27/2011 3:16 PM   CC:

## 2011-10-27 NOTE — Progress Notes (Signed)
Patient did not experience any of the following events: a burn prior to discharge; a fall within the facility; wrong site/side/patient/procedure/implant event; or a hospital transfer or hospital admission upon discharge from the facility. (G8907) Patient did not have preoperative order for IV antibiotic SSI prophylaxis. (G8918)  

## 2011-10-27 NOTE — Progress Notes (Signed)
Patient waking up, pulling off BP cuff, restless in bed. Tried to reorient patient but he is still restless. Wife at side quiet.

## 2011-10-27 NOTE — Op Note (Addendum)
[facility] [address] [place], [address]   COLONOSCOPY PROCEDURE REPORT  PATIENT: Marvin, [name]  MR#: [id number] BIRTHDATE: 1965/10/20 , 45  yrs. old GENDER: Male ENDOSCOPIST: Louis Meckel, MD REFERRED BY: PROCEDURE DATE:  10/27/2011 PROCEDURE:   Colonoscopy with biopsy ASA CLASS:   Class II INDICATIONS: MEDICATIONS: MAC sedation, administered by CRNA and propofol (Diprivan) 400mg  IV  DESCRIPTION OF PROCEDURE:   After the risks benefits and alternatives of the procedure were thoroughly explained, informed consent was obtained.  A digital rectal exam revealed no abnormalities of the rectum.   The LB CF-H180AL [id number]  endoscope was introduced through the anus and advanced to the terminal ileum which was intubated for a short distance. No adverse events experienced.   The quality of the prep was Suprep excellent  The instrument was then slowly withdrawn as the colon was fully examined.      COLON FINDINGS: There were 2 discrete ulcers measuring 5 and 8 mm , respectively, just proximal to the ileocecal valve.  Biopsies were taken.   The colon mucosa was otherwise normal.   Random biopsies were taken throughout the colon to rule out microscopic colitis. Random biopsies were taken throughout the colon to rule out microscopic colitis.  Retroflexed views revealed no abnormalities. The time to cecum=minutes 0 seconds.  Withdrawal time=9 minutes 56 seconds.  The scope was withdrawn and the procedure completed. COMPLICATIONS: There were no complications.  ENDOSCOPIC IMPRESSION: 1.   There were 2 discrete ulcers measuring 10 and 12mm , respectively, just proximal to the ileocecal valve.  Biopsies were taken. 2.   The colon mucosa was otherwise normal    RECOMMENDATIONS: Await biopsy findings   eSigned:  Louis Meckel, MD 10/27/2011 3:12 PM   cc:   PATIENT NAME:  Marvin [name], Marvin [name] MR#: [id number]

## 2011-10-27 NOTE — Patient Instructions (Addendum)
Colon ulcers x 2, biopsies taken today. Normal Endo. Exam. Return office visit on October 30, at 2:45 pm.   YOU HAD AN ENDOSCOPIC PROCEDURE TODAY AT THE [facility]: Refer to the procedure report that was given to you for any specific questions about what was found during the examination.  If the procedure report does not answer your questions, please call your gastroenterologist to clarify.  If you requested that your care partner not be given the details of your procedure findings, then the procedure report has been included in a sealed envelope for you to review at your convenience later.  YOU SHOULD EXPECT: Some feelings of bloating in the abdomen. Passage of more gas than usual.  Walking can help get rid of the air that was put into your GI tract during the procedure and reduce the bloating. If you had a lower endoscopy (such as a colonoscopy or flexible sigmoidoscopy) you may notice spotting of blood in your stool or on the toilet paper. If you underwent a bowel prep for your procedure, then you may not have a normal bowel movement for a few days.  DIET: Your first meal following the procedure should be a light meal and then it is ok to progress to your normal diet.  A half-sandwich or bowl of soup is an example of a good first meal.  Heavy or fried foods are harder to digest and may make you feel nauseous or bloated.  Likewise meals heavy in dairy and vegetables can cause extra gas to form and this can also increase the bloating.  Drink plenty of fluids but you should avoid alcoholic beverages for 24 hours.  ACTIVITY: Your care partner should take you home directly after the procedure.  You should plan to take it easy, moving slowly for the rest of the day.  You can resume normal activity the day after the procedure however you should NOT DRIVE or use heavy machinery for 24 hours (because of the sedation medicines used during the test).    SYMPTOMS TO REPORT IMMEDIATELY: A  gastroenterologist can be reached at any hour.  During normal business hours, 8:30 AM to 5:00 PM Monday through Friday, call [phone].  After hours and on weekends, please call the GI answering service at [phone] who will take a message and have the physician on call contact you.   Following lower endoscopy (colonoscopy or flexible sigmoidoscopy):  Excessive amounts of blood in the stool  Significant tenderness or worsening of abdominal pains  Swelling of the abdomen that is new, acute  Fever of 100F or higher  Following upper endoscopy (EGD)  Vomiting of blood or coffee ground material  New chest pain or pain under the shoulder blades  Painful or persistently difficult swallowing  New shortness of breath  Fever of 100F or higher  Black, tarry-looking stools  FOLLOW UP: If any biopsies were taken you will be contacted by phone or by letter within the next 1-3 weeks.  Call your gastroenterologist if you have not heard about the biopsies in 3 weeks.  Our staff will call the home number listed on your records the next business day following your procedure to check on you and address any questions or concerns that you may have at that time regarding the information given to you following your procedure. This is a courtesy call and so if there is no answer at the home number and we have not heard from you through the emergency physician on  physician on call, we will assume that you have returned to your regular daily activities without incident.  SIGNATURES/CONFIDENTIALITY: You and/or your care partner have signed paperwork which will be entered into your electronic medical record.  These signatures attest to the fact that that the information above on your After Visit Summary has been reviewed and is understood.  Full responsibility of the confidentiality of this discharge information lies with you and/or your care-partner. 

## 2011-10-27 NOTE — Progress Notes (Signed)
Patient was not cooperative , he was pulling at tubes and leads and would not stop when asked. He was saying things not appropriate,trying to pull off his gown and would not stop when asked. Patient appeared to be hard to wake up from the propofol. Vital signs stable. Once awake patient was discharged. Wife at bedside during recovery.

## 2011-10-28 ENCOUNTER — Telehealth: Payer: Self-pay | Admitting: Gastroenterology

## 2011-10-28 ENCOUNTER — Telehealth: Payer: Self-pay | Admitting: *Deleted

## 2011-10-28 NOTE — Telephone Encounter (Signed)
Check to see whether we have discount cards for lialda. I think we do. If not he can try Azulfidine 1 g twice a day

## 2011-10-28 NOTE — Telephone Encounter (Signed)
Dr Arlyce Dice, This patient had procedures yesterday, stated the medication you sent is 200$ is there another medication you can send

## 2011-10-28 NOTE — Telephone Encounter (Signed)
  Follow up Call-  Call back number 10/27/2011 03/30/2011 11/06/2010  Post procedure Call Back phone  # [phone]   Permission to leave phone message Yes Yes -     Patient questions:  Do you have a fever, pain , or abdominal swelling? no Pain Score  0 *  Have you tolerated food without any problems? yes  Have you been able to return to your normal activities? yes  Do you have any questions about your discharge instructions: Diet   no Medications  no Follow up visit  no  Do you have questions or concerns about your Care? no  Actions: * If pain score is 4 or above: No action needed, pain <4.

## 2011-10-29 ENCOUNTER — Telehealth: Payer: Self-pay | Admitting: Gastroenterology

## 2011-10-29 MED ORDER — MESALAMINE 1.2 G PO TBEC: 2.4000 g | DELAYED_RELEASE_TABLET | Freq: Every day | ORAL | Status: DC

## 2011-10-29 NOTE — Telephone Encounter (Signed)
Spoke with pt. He was not upset. Gave pt samples of Lialda and will send lialda rx to pharmacy at [facility] [place] [facility] to see if they are cheaper for him

## 2011-11-04 ENCOUNTER — Other Ambulatory Visit: Payer: Self-pay | Admitting: Gastroenterology

## 2011-11-04 MED ORDER — BUDESONIDE 3 MG PO CP24: ORAL_CAPSULE | ORAL | Status: DC

## 2011-11-15 ENCOUNTER — Telehealth: Payer: Self-pay | Admitting: Gastroenterology

## 2011-11-15 NOTE — Telephone Encounter (Signed)
Pt came in the office to see if there were more samples of entocort available. Pt given 2 bottles of samples and call placed to drug rep to see if we can obtain more samples. Pt enquired about getting scripts filled at the hospital pharmacy with the payment agreement that he has with [facility]. Outpt pharmacy called and they do not have special arrangements for pts that have the payment agreements. Will wait to hear back from [facility] with [facility] regarding samples.

## 2011-11-17 MED ORDER — BUDESONIDE 3 MG PO CP24: ORAL_CAPSULE | ORAL | Status: DC

## 2011-11-17 NOTE — Telephone Encounter (Signed)
Samples came in the office. Samples left up front for pt to pick up. Pt aware.

## 2011-11-17 NOTE — Addendum Note (Signed)
Addended by: Selinda Michaels R on: 11/17/2011 10:32 AM   Modules accepted: Orders

## 2011-11-24 ENCOUNTER — Encounter: Payer: Self-pay | Admitting: Gastroenterology

## 2011-11-24 ENCOUNTER — Ambulatory Visit (INDEPENDENT_AMBULATORY_CARE_PROVIDER_SITE_OTHER): Payer: Self-pay | Admitting: Gastroenterology

## 2011-11-24 VITALS — BP 100/72 | HR 76 | Ht 70.0 in | Wt 133.5 lb

## 2011-11-24 DIAGNOSIS — K52839 Microscopic colitis, unspecified: Secondary | ICD-10-CM

## 2011-11-24 DIAGNOSIS — K5289 Other specified noninfective gastroenteritis and colitis: Secondary | ICD-10-CM

## 2011-11-24 NOTE — Patient Instructions (Addendum)
You have been given a separate informational sheet regarding your tobacco use, the importance of quitting and local resources to help you quit. I will contact you if we receive any more samples

## 2011-11-24 NOTE — Assessment & Plan Note (Signed)
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 I will attempt to lower the dose of Entocort 3 mg daily and, after 3 months, try to discontinue this altogether.  Should he become symptomatic again at a lower maintenance dose, then I would consider Pepto-Bismol or look into other therapies for microscopic colitis.

## 2011-11-24 NOTE — Progress Notes (Signed)
History of Present Illness:  This is a followup visit for diarrhea. Colonoscopy was pertinent for 2 very superficial ulcers in the cecum. Biopsies of that area and random biopsies throughout a normal-appearing colon demonstrated findings consistent with microscopic colitis. He was placed on lialda but this was discontinued because diarrhea worsened. On Entocort he's had a complete response. Stools are solid and he is without pain or urgency.    Review of Systems: Pertinent positive and negative review of systems were noted in the above HPI section. All other review of systems were otherwise negative.    Current Medications, Allergies, Past Medical History, Past Surgical History, Family History and Social History were reviewed in [employer] electronic medical record  Vital signs were reviewed in today's medical record. Physical Exam: General: Well developed , well nourished, no acute distress

## 2011-12-09 ENCOUNTER — Telehealth: Payer: Self-pay | Admitting: Gastroenterology

## 2011-12-09 MED ORDER — BUDESONIDE 3 MG PO CP24: ORAL_CAPSULE | ORAL | Status: DC

## 2011-12-09 NOTE — Telephone Encounter (Signed)
ok 

## 2011-12-09 NOTE — Telephone Encounter (Signed)
DR Arlyce Dice, FYI-PATIENT CAME TO PICK UP SAMPLES- WE DO NOT HAVE ANY. SENT IN RX FOR PT. LESLIE SPOKE WITH PT AND HE WAS VERY RUDE. SAID HE WOULD GET HIS OWN SAMPLES OUT OF THE [facility]  BECAUSE HE KNOWS WHERE THEY ARE AT.

## 2011-12-10 NOTE — Telephone Encounter (Signed)
He needs f/u OV next 2-3 weeks

## 2011-12-10 NOTE — Telephone Encounter (Signed)
Patient scheduled for follow up visit on 12/30/2011 at 2:15pm pt aware

## 2011-12-10 NOTE — Telephone Encounter (Signed)
Patient came in office this morning. Was here at 7:30am. Waiting for me when I got off the elevator. Patient stated he could not afford the Entocort and wanted samples. Which he was told in the office yesterday that we did not have any samples. So I found [employer] samples. I called Dr Arlyce Dice at home to make sure I could give the patient these samples. Dr Arlyce Dice said it would be ok to give him [facility] to take it once a day.  Explained to the patient that he could take the Ucerius per Dr Arlyce Dice in place of the Entocort.

## 2011-12-29 ENCOUNTER — Emergency Department (HOSPITAL_COMMUNITY): Payer: Self-pay

## 2011-12-29 ENCOUNTER — Emergency Department (HOSPITAL_COMMUNITY)
Admission: EM | Admit: 2011-12-29 | Discharge: 2011-12-29 | Disposition: A | Discharge status: Home / Self Care | Payer: Self-pay | Attending: Emergency Medicine | Admitting: Emergency Medicine

## 2011-12-29 ENCOUNTER — Encounter (HOSPITAL_COMMUNITY): Payer: Self-pay | Admitting: Emergency Medicine

## 2011-12-29 DIAGNOSIS — K589 Irritable bowel syndrome without diarrhea: Secondary | ICD-10-CM | POA: Insufficient documentation

## 2011-12-29 DIAGNOSIS — W298XXA Contact with other powered powered hand tools and household machinery, initial encounter: Secondary | ICD-10-CM | POA: Insufficient documentation

## 2011-12-29 DIAGNOSIS — F17200 Nicotine dependence, unspecified, uncomplicated: Secondary | ICD-10-CM | POA: Insufficient documentation

## 2011-12-29 DIAGNOSIS — Y929 Unspecified place or not applicable: Secondary | ICD-10-CM | POA: Insufficient documentation

## 2011-12-29 DIAGNOSIS — Z79899 Other long term (current) drug therapy: Secondary | ICD-10-CM | POA: Insufficient documentation

## 2011-12-29 DIAGNOSIS — S6990XA Unspecified injury of unspecified wrist, hand and finger(s), initial encounter: Secondary | ICD-10-CM

## 2011-12-29 DIAGNOSIS — Z23 Encounter for immunization: Secondary | ICD-10-CM | POA: Insufficient documentation

## 2011-12-29 DIAGNOSIS — Y9389 Activity, other specified: Secondary | ICD-10-CM | POA: Insufficient documentation

## 2011-12-29 DIAGNOSIS — S60459A Superficial foreign body of unspecified finger, initial encounter: Secondary | ICD-10-CM | POA: Insufficient documentation

## 2011-12-29 MED ORDER — OXYCODONE-ACETAMINOPHEN 5-325 MG PO TABS
1.0000 | ORAL_TABLET | Freq: Once | ORAL | Status: AC
  Administered 2011-12-29: 1 via ORAL
  Filled 2011-12-29: qty 1

## 2011-12-29 MED ORDER — TETANUS-DIPHTH-ACELL PERTUSSIS 5-2.5-18.5 LF-MCG/0.5 IM SUSP
0.5000 mL | Freq: Once | INTRAMUSCULAR | Status: AC
  Administered 2011-12-29: 0.5 mL via INTRAMUSCULAR
  Filled 2011-12-29: qty 0.5

## 2011-12-29 MED ORDER — OXYCODONE-ACETAMINOPHEN 5-325 MG PO TABS: 1.0000 | ORAL_TABLET | Freq: Four times a day (QID) | ORAL | Status: DC | PRN

## 2011-12-29 MED ORDER — LIDOCAINE HCL 2 % IJ SOLN
10.0000 mL | Freq: Once | INTRAMUSCULAR | Status: AC
  Administered 2011-12-29: 10 mg via INTRADERMAL
  Filled 2011-12-29: qty 20

## 2011-12-29 MED ORDER — CLINDAMYCIN HCL 150 MG PO CAPS: 150.0000 mg | ORAL_CAPSULE | Freq: Four times a day (QID) | ORAL | Status: DC

## 2011-12-29 NOTE — ED Provider Notes (Signed)
History     CSN: [id number]  Arrival date & time 12/29/11  1325   First MD Initiated Contact with Patient 12/29/11 1416      Chief Complaint  Patient presents with  . Foreign Body in Skin    (Consider location/radiation/quality/duration/timing/severity/associated sxs/prior treatment) HPI  46 year-old male presents to ER after accidentally shot a nail into his L thumb while working.  Incident happened 2 hrs ago.  Pt tried removing the nail but was unable to.  C/o mild pain to affected area but denies numbness.  Unsure last tetanus.  Onset acute, L thumb, sharp sensation, non radiating, moderate in severity, no joint involvement.  No treatment tried.  No hx of diabetes.  Past Medical History  Diagnosis Date  . IBS (irritable bowel syndrome)     Past Surgical History  Procedure Date  . Mouth surgery   . Sigmoidoscopy   . Flexible sigmoidoscopy 04/21/2011    Procedure: FLEXIBLE SIGMOIDOSCOPY;  Surgeon: Louis Meckel, MD;  Location: [facility];  Service: Endoscopy;  Laterality: N/A;    Family History  Problem Relation Age of Onset  . Hypertension Father     History  Substance Use Topics  . Smoking status: Current Every Day Smoker -- 1.5 packs/day    Types: Cigarettes  . Smokeless tobacco: Never Used     Comment: for 30 yrs  . Alcohol Use: No      Review of Systems  Constitutional: Negative for fever.  Skin: Positive for wound.  Neurological: Negative for numbness.    Allergies  Ampicillin and Codeine  Home Medications   Current Outpatient Rx  Name  Route  Sig  Dispense  Refill  . BUDESONIDE ER 3 MG PO CP24      Lot number [id number] Exp:  04/25/14 6X6   48 capsule   11   . DIPHENOXYLATE-ATROPINE 2.5-0.025 MG PO TABS      T1-2 tabs every 6 hours as needed   200 tablet   3   . PRAMOXINE-HC 1-2.5 % EX CREA      Apply to affected area 3 times daily   30 g   1     BP 127/77  Pulse 81  Temp 99.1 F (37.3 C) (Oral)  Resp 16  SpO2  100%  Physical Exam  Nursing note and vitals reviewed. Constitutional: He appears well-developed and well-nourished. No distress.  HENT:  Head: Atraumatic.  Musculoskeletal: He exhibits tenderness (L thumb: a nail is embedded to skin at proximal phalanx, ttp.  no joint involvement.  ).  Neurological: He is alert.  Skin: Skin is warm. No rash noted.  Psychiatric: He has a normal mood and affect.    ED Course  ORTHOPEDIC INJURY TREATMENT Date/Time: 12/29/2011 3:50 PM Performed by: Fayrene Helper Authorized by: Fayrene Helper Consent: Verbal consent obtained. Risks and benefits: risks, benefits and alternatives were discussed Consent given by: patient Patient understanding: patient states understanding of the procedure being performed Patient consent: the patient's understanding of the procedure matches consent given Test results: test results available and properly labeled Site marked: the operative site was marked Imaging studies: imaging studies available Patient identity confirmed: verbally with patient Injury location: left thumb. Pre-procedure neurovascular assessment: neurovascularly intact Pre-procedure distal perfusion: normal Pre-procedure neurological function: normal Pre-procedure range of motion: normal Local anesthesia used: yes Anesthesia: digital block Local anesthetic: lidocaine 2% without epinephrine Anesthetic total (ml): 6. Post-procedure neurovascular assessment: post-procedure neurovascularly intact Post-procedure distal perfusion: normal Post-procedure neurological function comment: after digital  block, finger is numb.  Unable to assess post neurovascular status Patient tolerance: Patient tolerated the procedure well with no immediate complications. Comments: Pt with a nail gun injury when he accidentally shot a nail through proximal phalanx of his L thumb.  Nail embedded in bone.  After wound were cleansed, and dressed in sterile fashion, digital block with lido  2% were injected with good anesthesia.  Nail were removed using surgical plyer successfully.  Wound were irrigated using angiocath and NS.  Wound were dressed with pressure dressing.  Pt instruct to hold finger above heart at home to help decrease swelling.  Pt voice understanding.     (including critical care time)  Labs Reviewed - No data to display Dg Hand Complete Left  12/29/2011  *RADIOLOGY REPORT*  Clinical Data: Foreign body left thumb, nail gun, pain  LEFT HAND - COMPLETE 3+ VIEW  Comparison: None  Findings: Linear metallic foreign body (nail) identified at proximal phalanx of the left thumb, extending intraosseous. No definite fracture or dislocation identified. Joint spaces preserved. No additional osseous abnormalities identified.  IMPRESSION: Linear metallic foreign body (nail) extending intraosseous at the distal aspect of the proximal phalanx left thumb.   Original Report Authenticated By: Ulyses Southward, M.D.      No diagnosis found.  1. Removal of nail from left thumb  MDM  Pt accidentally shot nail through L thumb with nail gun.  Xray confirm nail embedded to bone of proximal phalanx.  I have consulted with my attending in regard to removal.  The concern is infection if nail is removed and we are unable to irrigated appropriately due to size of the puncture wound.    3:08 PM i have consulted with hand Specialist, Dr. Melvyn Novas, who recommend for me to remove nails and have pt f/u with him outpt.  My attending is aware.  Pt agrees with plan.    3:56 PM Successful removal of nail from finger.  Wound were irrigated to the best of my ability.  Care instruction including keeping hand elevated, iced to help decrease swelling, abx to decrease risk of infection, signs for prompt return if signs of infection or of neurovascular compromise.  Pain medication prescribed.  Pt voice undestanding and agrees with plan.  Ortho referral recommended.  Pt allergic to ampicillin, will give clindamycin  instead.   BP 127/77  Pulse 81  Temp 99.1 F (37.3 C) (Oral)  Resp 16  SpO2 100%  I have reviewed nursing notes and vital signs. I personally reviewed the imaging tests through PACS system  I reviewed available ER/hospitalization records thought the EMR     Fayrene Helper, PA-C 12/29/11 1600  Fayrene Helper, PA-C 12/29/11 1607

## 2011-12-29 NOTE — ED Notes (Signed)
Pt shot a nail into his left thumb with a nail gun. Pt tried to remove it but was unable to.  Unsure of last Td.

## 2011-12-29 NOTE — ED Provider Notes (Signed)
Medical screening examination/treatment/procedure(s) were performed by non-physician practitioner and as supervising physician I was immediately available for consultation/collaboration.  Aime Carreras, MD 12/29/11 1707 

## 2011-12-30 ENCOUNTER — Ambulatory Visit (INDEPENDENT_AMBULATORY_CARE_PROVIDER_SITE_OTHER): Payer: Self-pay | Admitting: Gastroenterology

## 2011-12-30 ENCOUNTER — Encounter: Payer: Self-pay | Admitting: Gastroenterology

## 2011-12-30 VITALS — BP 114/62 | HR 75 | Wt 133.0 lb

## 2011-12-30 DIAGNOSIS — K52839 Microscopic colitis, unspecified: Secondary | ICD-10-CM

## 2011-12-30 DIAGNOSIS — K5289 Other specified noninfective gastroenteritis and colitis: Secondary | ICD-10-CM

## 2011-12-30 MED ORDER — HYOSCYAMINE SULFATE ER 0.375 MG PO TBCR: EXTENDED_RELEASE_TABLET | ORAL | Status: DC

## 2011-12-30 NOTE — Progress Notes (Signed)
History of Present Illness:  The patient has returned for followup of microscopic colitis. He's doing well on a regimen of Uceris 9 mg a day. He no longer has diarrhea. There are times that he has abdominal discomfort and bloating.    Review of Systems: Pertinent positive and negative review of systems were noted in the above HPI section. All other review of systems were otherwise negative.    Current Medications, Allergies, Past Medical History, Past Surgical History, Family History and Social History were reviewed in [employer] electronic medical record  Vital signs were reviewed in today's medical record. Physical Exam: General: Well developed , well nourished, no acute distress

## 2011-12-30 NOTE — Assessment & Plan Note (Signed)
He's had a good response to uceris and Entocort.  We'll try to switch him off steroids by starting Pepto-Bismol 2 tabs 4 times a day and hyomax as needed

## 2011-12-30 NOTE — Patient Instructions (Addendum)
Hold Uceris  Follow up in 3 months Begin Pepto-Bismol 2 tablets 4 times a day

## 2012-04-01 ENCOUNTER — Other Ambulatory Visit: Payer: Self-pay | Admitting: Internal Medicine

## 2012-04-01 MED ORDER — DIPHENOXYLATE-ATROPINE 2.5-0.025 MG PO TABS: ORAL_TABLET | ORAL | Status: DC

## 2012-04-01 NOTE — Progress Notes (Signed)
He called - out of Lomotil Will refill #30 and he will call re: follow-up and refilling long-term rx

## 2012-04-04 ENCOUNTER — Telehealth: Payer: Self-pay | Admitting: Gastroenterology

## 2012-04-04 NOTE — Telephone Encounter (Signed)
Pt called wanting to be seen right away by Dr. Arlyce Dice. Let pt know he could be seen tomorrow at 3:30pm. Tried to get pt to describe his symptoms. Pt just states "things seem to be getting worse, I think the ulcers are growing." Pt thinks we are just treating the symptoms and not the problems. Finally able to get the pt to state that he is having lower abdominal pain in the middle of his abdomen. Pt very abrupt on the phone, states that I must be having a bad day because I was giving him a hard time trying to find out the symptoms that he is experiencing. Explained to the pt that we needed to know what problems he was having to get him scheduled. Pt aware of appt date and time.

## 2012-04-05 ENCOUNTER — Ambulatory Visit (INDEPENDENT_AMBULATORY_CARE_PROVIDER_SITE_OTHER): Payer: Self-pay | Admitting: Gastroenterology

## 2012-04-05 ENCOUNTER — Encounter: Payer: Self-pay | Admitting: Gastroenterology

## 2012-04-05 VITALS — BP 118/68 | HR 80 | Ht 70.0 in | Wt 141.4 lb

## 2012-04-05 DIAGNOSIS — K648 Other hemorrhoids: Secondary | ICD-10-CM

## 2012-04-05 MED ORDER — HYOSCYAMINE SULFATE ER 0.375 MG PO TBCR: EXTENDED_RELEASE_TABLET | ORAL | Status: DC

## 2012-04-05 MED ORDER — DIPHENOXYLATE-ATROPINE 2.5-0.025 MG PO TABS: ORAL_TABLET | ORAL | Status: DC

## 2012-04-05 NOTE — Assessment & Plan Note (Signed)
Complaining of rectal discomfort. Instructed patient to use hemorrhoids suppositories as needed.

## 2012-04-05 NOTE — Assessment & Plan Note (Signed)
Patient remains in remission on a regimen of Lomotil only. Abdominal pain may or may not be related but is responding to hyomax. Plan to continue with the same.

## 2012-04-05 NOTE — Progress Notes (Signed)
History of Present Illness:  The patient has returned for followup of microscopic colitis. His only medication is Lomotil 5 tablets a day. His main complaint is burning right periumbilical pain. This tends to occur postprandially and may last for several hours. It is intermittent. He responds to hyomax. Stools are solid and without any bleeding.  He's complaining of rectal discomfort which he attributes to hemorrhoids.    Review of Systems: Pertinent positive and negative review of systems were noted in the above HPI section. All other review of systems were otherwise negative.    Current Medications, Allergies, Past Medical History, Past Surgical History, Family History and Social History were reviewed in [employer] electronic medical record  Vital signs were reviewed in today's medical record. Physical Exam: General: Well developed , well nourished, no acute distress Abdomen is without masses, tenderness or organomegaly

## 2012-04-11 ENCOUNTER — Telehealth: Payer: Self-pay | Admitting: Gastroenterology

## 2012-04-11 NOTE — Telephone Encounter (Signed)
Dismissal Letter sent by Certified Mail 04/11/2012  Received the Return Receipt showing someone picked up the Dismissal Letter 04/13/2012

## 2012-11-30 ENCOUNTER — Other Ambulatory Visit: Payer: Self-pay | Admitting: Physician Assistant

## 2013-05-14 ENCOUNTER — Ambulatory Visit: Payer: Self-pay

## 2013-12-11 ENCOUNTER — Emergency Department (HOSPITAL_COMMUNITY)
Admission: EM | Admit: 2013-12-11 | Discharge: 2013-12-11 | Disposition: A | Discharge status: Home / Self Care | Payer: Self-pay | Attending: Emergency Medicine | Admitting: Emergency Medicine

## 2013-12-11 ENCOUNTER — Encounter (HOSPITAL_COMMUNITY): Payer: Self-pay | Admitting: *Deleted

## 2013-12-11 DIAGNOSIS — Z720 Tobacco use: Secondary | ICD-10-CM | POA: Insufficient documentation

## 2013-12-11 DIAGNOSIS — K589 Irritable bowel syndrome without diarrhea: Secondary | ICD-10-CM | POA: Insufficient documentation

## 2013-12-11 DIAGNOSIS — M5442 Lumbago with sciatica, left side: Secondary | ICD-10-CM

## 2013-12-11 DIAGNOSIS — R200 Anesthesia of skin: Secondary | ICD-10-CM | POA: Insufficient documentation

## 2013-12-11 DIAGNOSIS — Z9889 Other specified postprocedural states: Secondary | ICD-10-CM | POA: Insufficient documentation

## 2013-12-11 DIAGNOSIS — Z7952 Long term (current) use of systemic steroids: Secondary | ICD-10-CM | POA: Insufficient documentation

## 2013-12-11 MED ORDER — PREDNISONE 20 MG PO TABS: ORAL_TABLET | ORAL | Status: DC

## 2013-12-11 MED ORDER — CYCLOBENZAPRINE HCL 10 MG PO TABS: 10.0000 mg | ORAL_TABLET | Freq: Two times a day (BID) | ORAL | Status: DC | PRN

## 2013-12-11 MED ORDER — OXYCODONE-ACETAMINOPHEN 5-325 MG PO TABS: 1.0000 | ORAL_TABLET | ORAL | Status: DC | PRN

## 2013-12-11 NOTE — ED Provider Notes (Signed)
CSN: [id number]     Arrival date & time 12/11/13  0802 History   First MD Initiated Contact with Patient 12/11/13 0820     Chief Complaint  Patient presents with  . Back Pain     (Consider location/radiation/quality/duration/timing/severity/associated sxs/prior Treatment) HPI Pt is a 48yo male with hx of IBS and microscopic colitis presenting to ED with c/o lower back pain that has gradually worsened since raking leaves on Sunday, 11/15.  Pt states years ago he was advised his had herniated discs at L3-L4 and L4-L5.  Was able to improve after conservative treatment with the help of orthopedics at that time. No hx of back surgeries.  Today, pain is constant, aching and sharp shooting pain down front of left thigh on occasion. Pain is 10/10 at this time w/o relief of cold and warm compresses or tylenol. 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 Reports having percocet, flexeril and a steroid dose pack in the past which did help.  Denies recent falls or trauma. Denies change in bowel or bladder habits. No fever, n/v/d. No abdominal pain. No other concerns.   Past Medical History  Diagnosis Date  . IBS (irritable bowel syndrome)   . Microscopic colitis    Past Surgical History  Procedure Laterality Date  . Mouth surgery    . Sigmoidoscopy    . Flexible sigmoidoscopy  04/21/2011    Procedure: FLEXIBLE SIGMOIDOSCOPY;  Surgeon: Louis Meckelobert D Kaplan, MD;  Location: [facility];  Service: Endoscopy;  Laterality: N/A;   Family History  Problem Relation Age of Onset  . Hypertension Father    History  Substance Use Topics  . Smoking status: Current Every Day Smoker -- 1.50 packs/day    Types: Cigarettes  . Smokeless tobacco: Never Used     Comment: for 30 yrs  . Alcohol Use: No    Review of Systems  Constitutional: Negative for fever and chills.  Gastrointestinal: Negative for nausea, vomiting and abdominal pain.  Genitourinary:  Negative for dysuria, frequency, hematuria and flank pain.  Musculoskeletal: Positive for myalgias, back pain and gait problem. Negative for neck pain and neck stiffness.  Skin: Negative for color change and wound.  Neurological: Positive for numbness ( anterior left thigh). Negative for weakness.      Allergies  Ampicillin and Codeine  Home Medications   Prior to Admission medications   Medication Sig Start Date End Date Taking? Authorizing Provider  Budesonide (UCERIS PO) Take 1 tablet by mouth daily.    Historical Provider, MD  cyclobenzaprine (FLEXERIL) 10 MG tablet Take 1 tablet (10 mg total) by mouth 2 (two) times daily as needed for muscle spasms. 12/11/13   Junius FinnerErin O'Malley, PA-C  diphenoxylate-atropine (LOMOTIL) 2.5-0.025 MG per tablet T1-2 tabs every 6 hours as needed 04/05/12   Louis Meckelobert D Kaplan, MD  Hyoscyamine Sulfate 0.375 MG TBCR Take one tablet twice a day as needed for abdominal pain 04/05/12   Louis Meckelobert D Kaplan, MD  oxyCODONE-acetaminophen (PERCOCET/ROXICET) 5-325 MG per tablet Take 1-2 tablets by mouth every 4 (four) hours as needed for moderate pain or severe pain. 12/11/13   Junius FinnerErin O'Malley, PA-C  predniSONE (DELTASONE) 20 MG tablet 3 tabs po day one, then 2 po daily x 4 days 12/11/13   Junius FinnerErin O'Malley, PA-C   BP 134/77 mmHg  Pulse 92  Temp(Src) 97.8 F (36.6 C) (Oral)  Resp 18  SpO2 100% Physical Exam  Constitutional: He is  oriented to person, place, and time. He appears well-developed and well-nourished.  HENT:  Head: Normocephalic and atraumatic.  Eyes: EOM are normal.  Neck: Normal range of motion.  Cardiovascular: Normal rate.   Pulmonary/Chest: Effort normal.  Musculoskeletal: Normal range of motion. He exhibits tenderness. He exhibits no edema.  FROM arms and legs bilaterally. Tenderness long lower lumbar spine and left lumbar musculature. 5/5 strength in upper and lower extremities bilaterally.   Neurological: He is alert and oriented to person, place, and time.   Antalgic gait. Sensation in tact in upper and lower extremities bilaterally.   Skin: Skin is warm and dry.  Psychiatric: He has a normal mood and affect. His behavior is normal.  Nursing note and vitals reviewed.   ED Course  Procedures (including critical care time) Labs Review Labs Reviewed - No data to display  Imaging Review No results found.   EKG Interpretation None      MDM   Final diagnoses:  Left-sided low back pain with left-sided sciatica    Pt presenting to ED with lower back pain after raking this weekend.  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 No red flag symptoms on exam.   Do not believe imaging needed at this time. Not concerned for emergent process taking place. Will tx symptomatically as needed for pain. Rx: percocet, flexeril, and prednisone taper. Home care instructions provided. [facility]. Return precautions provided. Pt verbalized understanding and agreement with tx plan.     Junius Finnerrin O'Malley, PA-C 12/11/13 [id number]  Warnell Foresterrey Wofford, MD 12/17/13 [phone]

## 2013-12-11 NOTE — ED Notes (Signed)
Pt reports hx of back issues, reports he was raking leaves on Sunday and had a "flare up". Lower back pain 9/10. Ambulatory.

## 2013-12-11 NOTE — ED Notes (Signed)
EDP at bedside  

## 2013-12-16 ENCOUNTER — Encounter (HOSPITAL_COMMUNITY): Payer: Self-pay | Admitting: Emergency Medicine

## 2013-12-16 ENCOUNTER — Emergency Department (HOSPITAL_COMMUNITY)
Admission: EM | Admit: 2013-12-16 | Discharge: 2013-12-16 | Disposition: A | Discharge status: Home / Self Care | Payer: Self-pay | Attending: Emergency Medicine | Admitting: Emergency Medicine

## 2013-12-16 DIAGNOSIS — Z7952 Long term (current) use of systemic steroids: Secondary | ICD-10-CM | POA: Insufficient documentation

## 2013-12-16 DIAGNOSIS — M5442 Lumbago with sciatica, left side: Secondary | ICD-10-CM | POA: Insufficient documentation

## 2013-12-16 DIAGNOSIS — Z720 Tobacco use: Secondary | ICD-10-CM | POA: Insufficient documentation

## 2013-12-16 DIAGNOSIS — Z8719 Personal history of other diseases of the digestive system: Secondary | ICD-10-CM | POA: Insufficient documentation

## 2013-12-16 MED ORDER — CYCLOBENZAPRINE HCL 10 MG PO TABS: 10.0000 mg | ORAL_TABLET | Freq: Two times a day (BID) | ORAL | Status: DC | PRN

## 2013-12-16 MED ORDER — PREDNISONE 50 MG PO TABS: 50.0000 mg | ORAL_TABLET | Freq: Every day | ORAL | Status: AC

## 2013-12-16 MED ORDER — OXYCODONE-ACETAMINOPHEN 5-325 MG PO TABS: 1.0000 | ORAL_TABLET | Freq: Three times a day (TID) | ORAL | Status: DC | PRN

## 2013-12-16 NOTE — ED Notes (Signed)
Pt was seen on the 17th for same back issues, pt c/o lower back pain. Pt states pain is worse.

## 2013-12-16 NOTE — Discharge Instructions (Signed)
As discussed, it is important that you follow up with your physician for continued management of your condition.  If you develop any new, or concerning changes in your condition, please return to the emergency department immediately.   Back Exercises Back exercises help treat and prevent back injuries. The goal of back exercises is to increase the strength of your abdominal and back muscles and the flexibility of your back. These exercises should be started when you no longer have back pain. Back exercises include:  Pelvic Tilt. Lie on your back with your knees bent. Tilt your pelvis until the lower part of your back is against the floor. Hold this position 5 to 10 sec and repeat 5 to 10 times.  Knee to Chest. Pull first 1 knee up against your chest and hold for 20 to 30 seconds, repeat this with the other knee, and then both knees. This may be done with the other leg straight or bent, whichever feels better.  Sit-Ups or Curl-Ups. Bend your knees 90 degrees. Start with tilting your pelvis, and do a partial, slow sit-up, lifting your trunk only 30 to 45 degrees off the floor. Take at least 2 to 3 seconds for each sit-up. Do not do sit-ups with your knees out straight. If partial sit-ups are difficult, simply do the above but with only tightening your abdominal muscles and holding it as directed.  Hip-Lift. Lie on your back with your knees flexed 90 degrees. Push down with your feet and shoulders as you raise your hips a couple inches off the floor; hold for 10 seconds, repeat 5 to 10 times.  Back arches. Lie on your stomach, propping yourself up on bent elbows. Slowly press on your hands, causing an arch in your low back. Repeat 3 to 5 times. Any initial stiffness and discomfort should lessen with repetition over time.  Shoulder-Lifts. Lie face down with arms beside your body. Keep hips and torso pressed to floor as you slowly lift your head and shoulders off the floor. Do not overdo your  exercises, especially in the beginning. Exercises may cause you some mild back discomfort which lasts for a few minutes; however, if the pain is more severe, or lasts for more than 15 minutes, do not continue exercises until you see your caregiver. Improvement with exercise therapy for back problems is slow.  See your caregivers for assistance with developing a proper back exercise program. Document Released: 02/19/2004 Document Revised: 04/05/2011 Document Reviewed: 11/12/2010 [facility] Patient Information 2015 [place], [place]. This information is not intended to replace advice given to you by your health care provider. Make sure you discuss any questions you have with your health care provider.  Back Pain, Adult Back pain is very common. The pain often gets better over time. The cause of back pain is usually not dangerous. Most people can learn to manage their back pain on their own.  HOME CARE   Stay active. Start with short walks on flat ground if you can. Try to walk farther each day.  Do not sit, drive, or stand in one place for more than 30 minutes. Do not stay in bed.  Do not avoid exercise or work. Activity can help your back heal faster.  Be careful when you bend or lift an object. Bend at your knees, keep the object close to you, and do not twist.  Sleep on a firm mattress. Lie on your side, and bend your knees. If you lie on your back, put a pillow  under your knees.  Only take medicines as told by your doctor.  Put ice on the injured area.  Put ice in a plastic bag.  Place a towel between your skin and the bag.  Leave the ice on for 15-20 minutes, 03-04 times a day for the first 2 to 3 days. After that, you can switch between ice and heat packs.  Ask your doctor about back exercises or massage.  Avoid feeling anxious or stressed. Find good ways to deal with stress, such as exercise. GET HELP RIGHT AWAY IF:   Your pain does not go away with rest or medicine.  Your pain  does not go away in 1 week.  You have new problems.  You do not feel well.  The pain spreads into your legs.  You cannot control when you poop (bowel movement) or pee (urinate).  Your arms or legs feel weak or lose feeling (numbness).  You feel sick to your stomach (nauseous) or throw up (vomit).  You have belly (abdominal) pain.  You feel like you may pass out (faint). MAKE SURE YOU:   Understand these instructions.  Will watch your condition.  Will get help right away if you are not doing well or get worse. Document Released: 06/30/2007 Document Revised: 04/05/2011 Document Reviewed: 05/15/2013 [facility] Patient Information 2015 [place], [place]. This information is not intended to replace advice given to you by your health care provider. Make sure you discuss any questions you have with your health care provider.

## 2013-12-16 NOTE — ED Provider Notes (Signed)
CSN: [id number]     Arrival date & time 12/16/13  0720 History   First MD Initiated Contact with Patient 12/16/13 0725     Chief Complaint  Patient presents with  . Back Pain     HPI  Patient presents with back pain. Patient has a history of intermittent back pain for several years, after an initial injury to lumbar disks. For the past week patient has had persistent pain focally in the mid low back with radiation to the left lateral thigh.  Patient was seen here 5 days ago for pain.  Since that time patient had some improvement with a course of steroids, narcotics. Patient continues to complain of pain.  Patient has scheduled follow-up with orthopedics in 10 days. No new abdominal pain, incontinence, fever, falling, loss of sensation anywhere.   Past Medical History  Diagnosis Date  . IBS (irritable bowel syndrome)   . Microscopic colitis    Past Surgical History  Procedure Laterality Date  . Mouth surgery    . Sigmoidoscopy    . Flexible sigmoidoscopy  04/21/2011    Procedure: FLEXIBLE SIGMOIDOSCOPY;  Surgeon: Louis Meckelobert D Kaplan, MD;  Location: [facility];  Service: Endoscopy;  Laterality: N/A;   Family History  Problem Relation Age of Onset  . Hypertension Father    History  Substance Use Topics  . Smoking status: Current Every Day Smoker -- 1.50 packs/day    Types: Cigarettes  . Smokeless tobacco: Never Used     Comment: for 30 yrs  . Alcohol Use: No    Review of Systems  All other systems reviewed and are negative.     Allergies  Ampicillin and Codeine  Home Medications   Prior to Admission medications   Medication Sig Start Date End Date Taking? Authorizing Provider  Budesonide (UCERIS PO) Take 1 tablet by mouth daily.    Historical Provider, MD  cyclobenzaprine (FLEXERIL) 10 MG tablet Take 1 tablet (10 mg total) by mouth 2 (two) times daily as needed for muscle spasms. 12/16/13   Gerhard Munchobert Alizah Sills, MD  dicyclomine (BENTYL) 10 MG capsule  11/28/13    Historical Provider, MD  diphenoxylate-atropine (LOMOTIL) 2.5-0.025 MG per tablet T1-2 tabs every 6 hours as needed 04/05/12   Louis Meckelobert D Kaplan, MD  Hyoscyamine Sulfate 0.375 MG TBCR Take one tablet twice a day as needed for abdominal pain 04/05/12   Louis Meckelobert D Kaplan, MD  oxyCODONE-acetaminophen (PERCOCET/ROXICET) 5-325 MG per tablet Take 1 tablet by mouth every 8 (eight) hours as needed for severe pain. 12/16/13   Gerhard Munchobert Gaylin Bulthuis, MD  predniSONE (DELTASONE) 50 MG tablet Take 1 tablet (50 mg total) by mouth daily. 12/16/13 12/20/13  Gerhard Munchobert Valeriano Bain, MD   BP 133/84 mmHg  Pulse 116  Temp(Src) 98 F (36.7 C) (Oral)  Resp 18  SpO2 100% Physical Exam  Constitutional: He is oriented to person, place, and time. He appears well-developed. No distress.  HENT:  Head: Normocephalic and atraumatic.  Eyes: Conjunctivae and EOM are normal.  Cardiovascular: Normal rate and regular rhythm.   Pulmonary/Chest: Effort normal. No stridor. No respiratory distress.  Abdominal: He exhibits no distension.  Musculoskeletal: He exhibits no edema.  Neurological: He is alert and oriented to person, place, and time. No cranial nerve deficit. He exhibits normal muscle tone. Coordination normal.  Strength in each hip is 5/5 with pain referred to the low back greater on the left.   Skin: Skin is warm and dry.  Psychiatric: He has a normal mood and affect.  Nursing note and vitals reviewed.   ED Course  Procedures (including critical care time) I reviewed the EMR  MDM   Final diagnoses:  Midline low back pain with left-sided sciatica    Patient presents with back pain.  No evidence for neurologic dysfunction, infection. Patient is ambulatory.  Patient has scheduled follow-up with orthopedics in 10 days, was provided medication to assist with pain control in the interim. In addition, patient was provided additional resources to therapists.    Gerhard Munchobert Maygen Sirico, MD 12/16/13 [phone]

## 2013-12-16 NOTE — ED Notes (Addendum)
Reviewed DC papers with patient. Resources to follow up with orthopedics and/or chiropractor and patient states " chiropractors are quacks I'm not going to that".  This RN also provided the number for [facility] to patient (he has no PCP and no insurance).Patient requesting a list of pharmacies. [place] pharmacies in [place] were provided. Pt ambulatory upon discharge with d/c papers in hand.

## 2013-12-16 NOTE — ED Notes (Addendum)
Pt reports that he was given a steroid, flexeril, and percocet for pain on 11/17 and it helped while he was taking it "but the pain is still pretty bad and I don't know what to do at this point". Pt grimaces when moving. Denies recent injury, no loss of bowel or bladder control.

## 2013-12-16 NOTE — ED Notes (Signed)
MD at bedside. 

## 2013-12-26 ENCOUNTER — Emergency Department (HOSPITAL_COMMUNITY)
Admission: EM | Admit: 2013-12-26 | Discharge: 2013-12-26 | Disposition: A | Discharge status: Home / Self Care | Payer: Self-pay | Attending: Emergency Medicine | Admitting: Emergency Medicine

## 2013-12-26 ENCOUNTER — Encounter (HOSPITAL_COMMUNITY): Payer: Self-pay | Admitting: Emergency Medicine

## 2013-12-26 DIAGNOSIS — Z880 Allergy status to penicillin: Secondary | ICD-10-CM | POA: Insufficient documentation

## 2013-12-26 DIAGNOSIS — M5442 Lumbago with sciatica, left side: Secondary | ICD-10-CM | POA: Insufficient documentation

## 2013-12-26 DIAGNOSIS — Z720 Tobacco use: Secondary | ICD-10-CM | POA: Insufficient documentation

## 2013-12-26 DIAGNOSIS — R000 Tachycardia, unspecified: Secondary | ICD-10-CM | POA: Insufficient documentation

## 2013-12-26 DIAGNOSIS — Z8719 Personal history of other diseases of the digestive system: Secondary | ICD-10-CM | POA: Insufficient documentation

## 2013-12-26 MED ORDER — CYCLOBENZAPRINE HCL 10 MG PO TABS: 10.0000 mg | ORAL_TABLET | Freq: Two times a day (BID) | ORAL | Status: DC | PRN

## 2013-12-26 MED ORDER — OXYCODONE-ACETAMINOPHEN 5-325 MG PO TABS: 1.0000 | ORAL_TABLET | Freq: Three times a day (TID) | ORAL | Status: DC | PRN

## 2013-12-26 NOTE — ED Notes (Signed)
Pt c/o low back pain.  States he has had back problems x 10 years but reinjured it about a month ago.  States it is not getting better.  HR 130 in triage.

## 2013-12-26 NOTE — ED Provider Notes (Signed)
CSN: [id number]     Arrival date & time 12/26/13  [id number] History   First MD Initiated Contact with Patient 12/26/13 0932     Chief Complaint  Patient presents with  . Back Pain   HPI [name] is a 48 year old male who presents with L-sided low back pain. He reports the pain was started when he was raking leaves and has been a problem ever since he sustained a fall from a ladder 10 years ago. He is due to see Orthopedics in a few days and would like something to help him get through to his appointment, namely a three-drug regimen he has received in the past: Percocet, steroid, Flexeril. He denies any fever, bowel/bladder dysfunction, night sweats, weight loss.    Past Medical History  Diagnosis Date  . IBS (irritable bowel syndrome)   . Microscopic colitis    Past Surgical History  Procedure Laterality Date  . Mouth surgery    . Sigmoidoscopy    . Flexible sigmoidoscopy  04/21/2011    Procedure: FLEXIBLE SIGMOIDOSCOPY;  Surgeon: Louis Meckelobert D Kaplan, MD;  Location: [facility];  Service: Endoscopy;  Laterality: N/A;   Family History  Problem Relation Age of Onset  . Hypertension Father    History  Substance Use Topics  . Smoking status: Current Every Day Smoker -- 1.50 packs/day    Types: Cigarettes  . Smokeless tobacco: Never Used     Comment: for 30 yrs  . Alcohol Use: No    Review of Systems  Constitutional: Positive for activity change. Negative for fever and unexpected weight change.  Genitourinary:       No bowel/bladder dysfunction  Musculoskeletal: Positive for back pain.  Neurological: Positive for numbness.      Allergies  Ampicillin and Codeine  Home Medications   Prior to Admission medications   Medication Sig Start Date End Date Taking? Authorizing Provider  cyclobenzaprine (FLEXERIL) 10 MG tablet Take 1 tablet (10 mg total) by mouth 2 (two) times daily as needed for muscle spasms. 12/26/13   Heywood Ilesushil Braxdon Gappa, MD  oxyCODONE-acetaminophen (PERCOCET/ROXICET)  5-325 MG per tablet Take 1 tablet by mouth every 8 (eight) hours as needed for severe pain. 12/26/13   Heywood Ilesushil Shan Valdes, MD   BP 139/96 mmHg  Pulse 113  Temp(Src) 98.2 F (36.8 C) (Oral)  Resp 18  Ht 5\' 10"  (1.778 m)  Wt 145 lb (65.772 kg)  BMI 20.81 kg/m2  SpO2 100% Physical Exam  Constitutional: He is oriented to person, place, and time. He appears well-developed and well-nourished. He appears distressed.  HENT:  Head: Normocephalic and atraumatic.  Cardiovascular: Regular rhythm and normal heart sounds.  Exam reveals no gallop and no friction rub.   No murmur heard. Tachycardic  Pulmonary/Chest: Effort normal and breath sounds normal. No respiratory distress.  Neurological: He is alert and oriented to person, place, and time. No sensory deficit.  Reflex Scores:      Patellar reflexes are 2+ on the right side and 2+ on the left side.      Achilles reflexes are 2+ on the right side and 2+ on the left side. RLE 5/5 on leg flexion/extension but LLE 4/5. Intact to sensation bilaterally.   Skin: He is not diaphoretic.    ED Course  Procedures (including critical care time) Labs Review Labs Reviewed - No data to display  Imaging Review No results found.   EKG Interpretation None      MDM   Final diagnoses:  Left-sided low back  pain with left-sided sciatica   Pain is consistent with prior episodes and likely lumbago. No red flags for cord compression. Per DEA database, he has no prior records of frequenting multiple EDs for pain medications, and chronic opiates are not the treatment of choice for his condition, especially in conjunction with a muscle relaxant. Neither are systemic steroids. He cannot articulate which medication worked the best for him though was hesitant on stopping Percocet. He acknowledges establishing follow-up with a PCP.      Heywood Ilesushil Kinzly Pierrelouis, MD 12/26/13 1043  Raeford RazorStephen Kohut, MD 12/26/13 [phone]

## 2013-12-26 NOTE — Progress Notes (Signed)
  CARE MANAGEMENT ED NOTE 12/26/2013  Patient:  Marvin [name],Marvin [name]   Account Number:  [id number]  Date Initiated:  12/26/2013  Documentation initiated by:  Dennis BastGIBBS,Dericka Ostenson  Subjective/Objective Assessment:   48 [place] [facility] in last month (12/11/13, 12/16/13 & 12/26/13) c/o c/o low back pain.  States he has had back problems x 10 years but reinjured it about a month ago.  States it is     Subjective/Objective Assessment Detail:   [facility]13 note pt stated hx of back issues, reports he was raking leaves on Sunday and had a "flare up  Pt confirms no pcp  Pt noted to be sitting in bedside chair throughout Cm interaction with him.  Pt noticed to wince in pain at intervals and reposition himself in chair  "I want this to get better so I don't have to keep coming back" " I wish I knew some of this information previously." "They wanted me to go to an orthropedic doctor but I can't afford it"  at 1021 pending any ED labs only abnormal is elevated heart rate of 130 with voiced physical s/s  Agreed to [facility] referral     Action/Plan:   CM noted pt without pcp, with 3 ED visits in last 6 months for back pain.  Cm spoke with Dr Allena KatzPatel EDP working with pt Spoke with pt see notes below   Action/Plan Detail:   Anticipated DC Date:  12/26/2013     Status Recommendation to Physician:   Result of Recommendation:    Other ED Services  Consult Working Plan    DC Planning Services  Other  Outpatient Services - Pt will follow up  PCP issues  [facility] / [facility] (established/new)    Choice offered to / List presented to:            Status of service:  Completed, signed off  ED Comments:   ED Comments Detail:  CM spoke with pt who [place]. CM discussed and provided written information for self pay pcps, importance of pcp for f/u care, difference in EDP & Pcp care,  www.needymeds.org, www.goodrx.com, discounted pharmacies and other [place] resources such as [facility], [facility], affordable care act, financial assistance, DSS and  health department  Reviewed resources for [place] self pay pcps like [facility], [facility] medicine at [address], [facility], general medical clinics, [facility] plus others, medication resources, [facility] out patient pharmacies and housing Pt voiced understanding and appreciation of resources provided  Provided [facility] contact information Referral completed to [facility] Encouraged pt to speak with biling offices of specialists like orthopedic providers

## 2013-12-26 NOTE — ED Notes (Signed)
MD at bedside. EDP KOHUT 

## 2013-12-26 NOTE — Discharge Instructions (Signed)
To avoid future ED visits, please establish with the [facility] as they can assist you with procuring your medications and getting you referred to Orthopedics.   Please be careful in taking Percocet with Flexiril as both can make you feel drowsy when taken together.

## 2014-05-01 ENCOUNTER — Emergency Department (HOSPITAL_COMMUNITY)
Admission: EM | Admit: 2014-05-01 | Discharge: 2014-05-01 | Disposition: A | Discharge status: Home / Self Care | Payer: Self-pay | Attending: Emergency Medicine | Admitting: Emergency Medicine

## 2014-05-01 ENCOUNTER — Encounter (HOSPITAL_COMMUNITY): Payer: Self-pay

## 2014-05-01 DIAGNOSIS — M5442 Lumbago with sciatica, left side: Secondary | ICD-10-CM | POA: Insufficient documentation

## 2014-05-01 DIAGNOSIS — Z720 Tobacco use: Secondary | ICD-10-CM | POA: Insufficient documentation

## 2014-05-01 DIAGNOSIS — K589 Irritable bowel syndrome without diarrhea: Secondary | ICD-10-CM | POA: Insufficient documentation

## 2014-05-01 DIAGNOSIS — Z7952 Long term (current) use of systemic steroids: Secondary | ICD-10-CM | POA: Insufficient documentation

## 2014-05-01 DIAGNOSIS — Z880 Allergy status to penicillin: Secondary | ICD-10-CM | POA: Insufficient documentation

## 2014-05-01 MED ORDER — PREDNISONE 10 MG PO TABS: ORAL_TABLET | ORAL | Status: DC

## 2014-05-01 MED ORDER — CYCLOBENZAPRINE HCL 10 MG PO TABS: 10.0000 mg | ORAL_TABLET | Freq: Three times a day (TID) | ORAL | Status: AC | PRN

## 2014-05-01 MED ORDER — OXYCODONE-ACETAMINOPHEN 5-325 MG PO TABS: 1.0000 | ORAL_TABLET | Freq: Three times a day (TID) | ORAL | Status: DC | PRN

## 2014-05-01 NOTE — ED Notes (Signed)
Patient states that he rode a roller coaster this weekend and now is experiencing  Mid lower back pain. Patient states he has tingling down the left leg, but denies any numbness. MAE.

## 2014-05-01 NOTE — Discharge Instructions (Signed)
Do not work or drive while taking the medications for pain and muscle relaxation. Use heat on the sore area 3 or 4 times a day. Follow-up with a primary care doctor for ongoing management of your back pain. The Emergency department cannot continue to function as a resource for treatment of your recurrent back problem.    Back Pain, Adult Back pain is very common. The pain often gets better over time. The cause of back pain is usually not dangerous. Most people can learn to manage their back pain on their own.  HOME CARE   Stay active. Start with short walks on flat ground if you can. Try to walk farther each day.  Do not sit, drive, or stand in one place for more than 30 minutes. Do not stay in bed.  Do not avoid exercise or work. Activity can help your back heal faster.  Be careful when you bend or lift an object. Bend at your knees, keep the object close to you, and do not twist.  Sleep on a firm mattress. Lie on your side, and bend your knees. If you lie on your back, put a pillow under your knees.  Only take medicines as told by your doctor.  Put ice on the injured area.  Put ice in a plastic bag.  Place a towel between your skin and the bag.  Leave the ice on for 15-20 minutes, 03-04 times a day for the first 2 to 3 days. After that, you can switch between ice and heat packs.  Ask your doctor about back exercises or massage.  Avoid feeling anxious or stressed. Find good ways to deal with stress, such as exercise. GET HELP RIGHT AWAY IF:   Your pain does not go away with rest or medicine.  Your pain does not go away in 1 week.  You have new problems.  You do not feel well.  The pain spreads into your legs.  You cannot control when you poop (bowel movement) or pee (urinate).  Your arms or legs feel weak or lose feeling (numbness).  You feel sick to your stomach (nauseous) or throw up (vomit).  You have belly (abdominal) pain.  You feel like you may pass out  (faint). MAKE SURE YOU:   Understand these instructions.  Will watch your condition.  Will get help right away if you are not doing well or get worse. Document Released: 06/30/2007 Document Revised: 04/05/2011 Document Reviewed: 05/15/2013 [facility] Patient Information 2015 [place], [place]. This information is not intended to replace advice given to you by your health care provider. Make sure you discuss any questions you have with your health care provider.  Sciatica Sciatica is pain, weakness, numbness, or tingling along the path of the sciatic nerve. The nerve starts in the lower back and runs down the back of each leg. The nerve controls the muscles in the lower leg and in the back of the knee, while also providing sensation to the back of the thigh, lower leg, and the sole of your foot. Sciatica is a symptom of another medical condition. For instance, nerve damage or certain conditions, such as a herniated disk or bone spur on the spine, pinch or put pressure on the sciatic nerve. This causes the pain, weakness, or other sensations normally associated with sciatica. Generally, sciatica only affects one side of the body. CAUSES   Herniated or slipped disc.  Degenerative disk disease.  A pain disorder involving the narrow muscle in the buttocks (piriformis  syndrome).  Pelvic injury or fracture.  Pregnancy.  Tumor (rare). SYMPTOMS  Symptoms can vary from mild to very severe. The symptoms usually travel from the low back to the buttocks and down the back of the leg. Symptoms can include:  Mild tingling or dull aches in the lower back, leg, or hip.  Numbness in the back of the calf or sole of the foot.  Burning sensations in the lower back, leg, or hip.  Sharp pains in the lower back, leg, or hip.  Leg weakness.  Severe back pain inhibiting movement. These symptoms may get worse with coughing, sneezing, laughing, or prolonged sitting or standing. Also, being overweight may  worsen symptoms. DIAGNOSIS  Your caregiver will perform a physical exam to look for common symptoms of sciatica. He or she may ask you to do certain movements or activities that would trigger sciatic nerve pain. Other tests may be performed to find the cause of the sciatica. These may include:  Blood tests.  X-rays.  Imaging tests, such as an MRI or CT scan. TREATMENT  Treatment is directed at the cause of the sciatic pain. Sometimes, treatment is not necessary and the pain and discomfort goes away on its own. If treatment is needed, your caregiver may suggest:  Over-the-counter medicines to relieve pain.  Prescription medicines, such as anti-inflammatory medicine, muscle relaxants, or narcotics.  Applying heat or ice to the painful area.  Steroid injections to lessen pain, irritation, and inflammation around the nerve.  Reducing activity during periods of pain.  Exercising and stretching to strengthen your abdomen and improve flexibility of your spine. Your caregiver may suggest losing weight if the extra weight makes the back pain worse.  Physical therapy.  Surgery to eliminate what is pressing or pinching the nerve, such as a bone spur or part of a herniated disk. HOME CARE INSTRUCTIONS   Only take over-the-counter or prescription medicines for pain or discomfort as directed by your caregiver.  Apply ice to the affected area for 20 minutes, 3-4 times a day for the first 48-72 hours. Then try heat in the same way.  Exercise, stretch, or perform your usual activities if these do not aggravate your pain.  Attend physical therapy sessions as directed by your caregiver.  Keep all follow-up appointments as directed by your caregiver.  Do not wear high heels or shoes that do not provide proper support.  Check your mattress to see if it is too soft. A firm mattress may lessen your pain and discomfort. SEEK IMMEDIATE MEDICAL CARE IF:   You lose control of your bowel or bladder  (incontinence).  You have increasing weakness in the lower back, pelvis, buttocks, or legs.  You have redness or swelling of your back.  You have a burning sensation when you urinate.  You have pain that gets worse when you lie down or awakens you at night.  Your pain is worse than you have experienced in the past.  Your pain is lasting longer than 4 weeks.  You are suddenly losing weight without reason. MAKE SURE YOU:  Understand these instructions.  Will watch your condition.  Will get help right away if you are not doing well or get worse. Document Released: 01/05/2001 Document Revised: 07/13/2011 Document Reviewed: 05/23/2011 [facility] Patient Information 2015 [place]. This information is not intended to replace advice given to you by your health care provider. Make sure you discuss any questions you have with your health care provider.  Back Exercises Back exercises  help treat and prevent back injuries. The goal of back exercises is to increase the strength of your abdominal and back muscles and the flexibility of your back. These exercises should be started when you no longer have back pain. Back exercises include:  Pelvic Tilt. Lie on your back with your knees bent. Tilt your pelvis until the lower part of your back is against the floor. Hold this position 5 to 10 sec and repeat 5 to 10 times.  Knee to Chest. Pull first 1 knee up against your chest and hold for 20 to 30 seconds, repeat this with the other knee, and then both knees. This may be done with the other leg straight or bent, whichever feels better.  Sit-Ups or Curl-Ups. Bend your knees 90 degrees. Start with tilting your pelvis, and do a partial, slow sit-up, lifting your trunk only 30 to 45 degrees off the floor. Take at least 2 to 3 seconds for each sit-up. Do not do sit-ups with your knees out straight. If partial sit-ups are difficult, simply do the above but with only tightening your abdominal muscles and  holding it as directed.  Hip-Lift. Lie on your back with your knees flexed 90 degrees. Push down with your feet and shoulders as you raise your hips a couple inches off the floor; hold for 10 seconds, repeat 5 to 10 times.  Back arches. Lie on your stomach, propping yourself up on bent elbows. Slowly press on your hands, causing an arch in your low back. Repeat 3 to 5 times. Any initial stiffness and discomfort should lessen with repetition over time.  Shoulder-Lifts. Lie face down with arms beside your body. Keep hips and torso pressed to floor as you slowly lift your head and shoulders off the floor. Do not overdo your exercises, especially in the beginning. Exercises may cause you some mild back discomfort which lasts for a few minutes; however, if the pain is more severe, or lasts for more than 15 minutes, do not continue exercises until you see your caregiver. Improvement with exercise therapy for back problems is slow.  See your caregivers for assistance with developing a proper back exercise program. Document Released: 02/19/2004 Document Revised: 04/05/2011 Document Reviewed: 11/12/2010 [facility] Patient Information 2015 [place], [place]. This information is not intended to replace advice given to you by your health care provider. Make sure you discuss any questions you have with your health care provider.

## 2014-05-01 NOTE — ED Provider Notes (Signed)
CSN: [id number]     Arrival date & time 05/01/14  4098 History   First MD Initiated Contact with Patient 05/01/14 [phone]     Chief Complaint  Patient presents with  . Back Pain     (Consider location/radiation/quality/duration/timing/severity/associated sxs/prior Treatment) Patient is a 49 y.o. male presenting with back pain. The history is provided by the patient.  Back Pain Marvin [name] is a 49 y.o. male who is here for evaluation of recurrent low back pain. He states he aggravated right humeral a cluster several days ago. He typically has an episode of similar pain every 6 months or so. He reports having lower lumbar intravertebral disc disease with herniation. Pain radiates to his left buttocks. He denies associated fever, chills, nausea, vomiting, weakness or dizziness. He's able to ambulate. He does not have primary care doctor or local back doctor. He works in [employer]. There are no other no modifying factors.  Past Medical History  Diagnosis Date  . IBS (irritable bowel syndrome)   . Microscopic colitis    Past Surgical History  Procedure Laterality Date  . Mouth surgery    . Sigmoidoscopy    . Flexible sigmoidoscopy  04/21/2011    Procedure: FLEXIBLE SIGMOIDOSCOPY;  Surgeon: Louis Meckel, MD;  Location: [facility];  Service: Endoscopy;  Laterality: N/A;   Family History  Problem Relation Age of Onset  . Hypertension Father    History  Substance Use Topics  . Smoking status: Current Every Day Smoker -- 1.50 packs/day    Types: Cigarettes  . Smokeless tobacco: Never Used     Comment: for 30 yrs  . Alcohol Use: No    Review of Systems  Musculoskeletal: Positive for back pain.  All other systems reviewed and are negative.     Allergies  Ampicillin and Codeine  Home Medications   Prior to Admission medications   Medication Sig Start Date End Date Taking? Authorizing Provider  budesonide (ENTOCORT EC) 3 MG 24 hr capsule Take 3 capsules by mouth  daily. 04/17/14  Yes Historical Provider, MD  ibuprofen (ADVIL,MOTRIN) 100 MG tablet Take 200 mg by mouth every 6 (six) hours as needed for pain.   Yes Historical Provider, MD  cyclobenzaprine (FLEXERIL) 10 MG tablet Take 1 tablet (10 mg total) by mouth 3 (three) times daily as needed for muscle spasms. 05/01/14   Mancel Bale, MD  oxyCODONE-acetaminophen (PERCOCET/ROXICET) 5-325 MG per tablet Take 1 tablet by mouth every 8 (eight) hours as needed for severe pain. 05/01/14   Mancel Bale, MD  predniSONE (DELTASONE) 10 MG tablet Take q day 6,5,4,3,2,1 05/01/14   Mancel Bale, MD   BP 128/83 mmHg  Pulse 100  Temp(Src) 97.7 F (36.5 C) (Oral)  Resp 18  Ht  (1.803 m)  Wt 145 lb (65.772 kg)  BMI 20.23 kg/m2  SpO2 100% Physical Exam  Constitutional: He is oriented to person, place, and time. He appears well-developed and well-nourished.  HENT:  Head: Normocephalic and atraumatic.  Right Ear: External ear normal.  Left Ear: External ear normal.  Eyes: Conjunctivae and EOM are normal. Pupils are equal, round, and reactive to light.  Neck: Normal range of motion and phonation normal. Neck supple.  Cardiovascular: Normal rate, regular rhythm and normal heart sounds.   Pulmonary/Chest: Effort normal and breath sounds normal. He exhibits no bony tenderness.  Abdominal: Soft. There is no tenderness.  Musculoskeletal: Normal range of motion.  Mild mid and left lower lumbar tenderness to palpation.  Fair range of motion of the back.  Neurological: He is alert and oriented to person, place, and time. No cranial nerve deficit or sensory deficit. He exhibits normal muscle tone. Coordination normal.  Skin: Skin is warm, dry and intact.  Psychiatric: He has a normal mood and affect. His behavior is normal. Judgment and thought content normal.  Nursing note and vitals reviewed.   ED Course  Procedures (including critical care time)  Reviewed the, [place]. Controlled substance reporting  system data bank : it indicates that he last had narcotics prescribed emergency department in November 2015. He is a chronic GI disorder that requires intermittent treatment with lomotil.  Labs Review Labs Reviewed - No data to display  Imaging Review No results found.   EKG Interpretation None      MDM   Final diagnoses:  Left-sided low back pain with left-sided sciatica     Recurrent back pain with sciatica. Alleged chronic degenerative disc disease. No acute lumbar myelopathy.  Nursing Notes Reviewed/ Care Coordinated Applicable Imaging Reviewed Interpretation of Laboratory Data incorporated into ED treatment  The patient appears reasonably screened and/or stabilized for discharge and I doubt any other medical condition or other [facility] requiring further screening, evaluation, or treatment in the ED at this time prior to discharge.  Plan: Home Medications- Percocet, Flexeril, Prednisone; Home Treatments- rest, heat; return here if the recommended treatment, does not improve the symptoms; Recommended follow up- admonished to f/u with PCP service asap, and instructed that ED cannot continue to treat recurrent pain episodes.   Mancel BaleElliott Dezaray Shibuya, MD 05/01/14 [phone]

## 2014-05-06 ENCOUNTER — Emergency Department (HOSPITAL_COMMUNITY)
Admission: EM | Admit: 2014-05-06 | Discharge: 2014-05-06 | Disposition: A | Discharge status: Home / Self Care | Payer: Self-pay | Attending: Emergency Medicine | Admitting: Emergency Medicine

## 2014-05-06 DIAGNOSIS — Z8719 Personal history of other diseases of the digestive system: Secondary | ICD-10-CM | POA: Insufficient documentation

## 2014-05-06 DIAGNOSIS — M545 Low back pain, unspecified: Secondary | ICD-10-CM

## 2014-05-06 DIAGNOSIS — Z79899 Other long term (current) drug therapy: Secondary | ICD-10-CM | POA: Insufficient documentation

## 2014-05-06 DIAGNOSIS — Z7951 Long term (current) use of inhaled steroids: Secondary | ICD-10-CM | POA: Insufficient documentation

## 2014-05-06 DIAGNOSIS — Z720 Tobacco use: Secondary | ICD-10-CM | POA: Insufficient documentation

## 2014-05-06 DIAGNOSIS — G8929 Other chronic pain: Secondary | ICD-10-CM | POA: Insufficient documentation

## 2014-05-06 DIAGNOSIS — R000 Tachycardia, unspecified: Secondary | ICD-10-CM | POA: Insufficient documentation

## 2014-05-06 MED ORDER — DEXAMETHASONE SODIUM PHOSPHATE 10 MG/ML IJ SOLN: 10.0000 mg | Freq: Once | INTRAMUSCULAR | Status: DC

## 2014-05-06 MED ORDER — ACETAMINOPHEN 325 MG PO TABS: 650.0000 mg | ORAL_TABLET | Freq: Once | ORAL | Status: DC

## 2014-05-06 MED ORDER — NAPROXEN 500 MG PO TABS: 500.0000 mg | ORAL_TABLET | Freq: Two times a day (BID) | ORAL | Status: DC | PRN

## 2014-05-06 MED ORDER — DIAZEPAM 5 MG PO TABS: 5.0000 mg | ORAL_TABLET | Freq: Once | ORAL | Status: DC

## 2014-05-06 MED ORDER — HYDROCODONE-ACETAMINOPHEN 5-325 MG PO TABS
2.0000 | ORAL_TABLET | Freq: Once | ORAL | Status: AC
  Administered 2014-05-06: 2 via ORAL
  Filled 2014-05-06: qty 2

## 2014-05-06 MED ORDER — KETOROLAC TROMETHAMINE 60 MG/2ML IM SOLN
60.0000 mg | Freq: Once | INTRAMUSCULAR | Status: DC
  Filled 2014-05-06: qty 2

## 2014-05-06 NOTE — ED Notes (Addendum)
This nurse verifies conversation via phone with Marvin [name]/brother that Casimiro NeedleMichael will pick pt up post discharge for pt safety post pain medication administration.
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
## 2014-05-06 NOTE — Discharge Instructions (Signed)
Please seek a primary doctor to coordinate your pain management and rehab of your back pain.  If you were given medicines take as directed.  If you are on coumadin or contraceptives realize their levels and effectiveness is altered by many different medicines.  If you have any reaction (rash, tongues swelling, other) to the medicines stop taking and see a physician.   Please follow up as directed and return to the ER or see a physician for new or worsening symptoms.  Thank you. Filed Vitals:   05/06/14 0751  BP: 130/92  Pulse: 107  Temp: 97.7 F (36.5 C)  TempSrc: Oral  Resp: 16  SpO2: 100%

## 2014-05-23 ENCOUNTER — Encounter (HOSPITAL_COMMUNITY): Payer: Self-pay | Admitting: Emergency Medicine

## 2014-05-23 ENCOUNTER — Emergency Department (HOSPITAL_COMMUNITY)
Admission: EM | Admit: 2014-05-23 | Discharge: 2014-05-23 | Disposition: A | Discharge status: Home / Self Care | Payer: Self-pay | Attending: Emergency Medicine | Admitting: Emergency Medicine

## 2014-05-23 DIAGNOSIS — Z79899 Other long term (current) drug therapy: Secondary | ICD-10-CM | POA: Insufficient documentation

## 2014-05-23 DIAGNOSIS — Z8719 Personal history of other diseases of the digestive system: Secondary | ICD-10-CM | POA: Insufficient documentation

## 2014-05-23 DIAGNOSIS — M549 Dorsalgia, unspecified: Secondary | ICD-10-CM

## 2014-05-23 DIAGNOSIS — Z720 Tobacco use: Secondary | ICD-10-CM | POA: Insufficient documentation

## 2014-05-23 DIAGNOSIS — M545 Low back pain: Secondary | ICD-10-CM | POA: Insufficient documentation

## 2014-05-23 DIAGNOSIS — G8929 Other chronic pain: Secondary | ICD-10-CM | POA: Insufficient documentation

## 2014-05-23 MED ORDER — PREDNISONE 10 MG PO TABS: ORAL_TABLET | ORAL | Status: DC

## 2014-05-23 MED ORDER — METHOCARBAMOL 500 MG PO TABS: 500.0000 mg | ORAL_TABLET | Freq: Two times a day (BID) | ORAL | Status: AC

## 2014-05-23 NOTE — ED Notes (Signed)
OTC meds not helping lower back pain x 3 days. Hx of flair ups of such.

## 2014-05-23 NOTE — Discharge Instructions (Signed)

## 2014-05-23 NOTE — ED Provider Notes (Signed)
CSN: [id number]     Arrival date & time 05/23/14  5284 History   First MD Initiated Contact with Patient 05/23/14 0725     Chief Complaint  Patient presents with  . Back Pain     (Consider location/radiation/quality/duration/timing/severity/associated sxs/prior Treatment) Patient is a 49 y.o. male presenting with back pain. The history is provided by the patient. No language interpreter was used.  Back Pain Location:  Lumbar spine Quality:  Aching Radiates to:  Does not radiate Pain severity:  Moderate Onset quality:  Gradual Timing:  Constant Progression:  Worsening Chronicity:  New Relieved by:  Nothing Worsened by:  Nothing tried Ineffective treatments:  None tried Associated symptoms: no tingling and no weakness     Past Medical History  Diagnosis Date  . IBS (irritable bowel syndrome)   . Microscopic colitis    Past Surgical History  Procedure Laterality Date  . Mouth surgery    . Sigmoidoscopy    . Flexible sigmoidoscopy  04/21/2011    Procedure: FLEXIBLE SIGMOIDOSCOPY;  Surgeon: Louis Meckel, MD;  Location: [facility];  Service: Endoscopy;  Laterality: N/A;   Family History  Problem Relation Age of Onset  . Hypertension Father    History  Substance Use Topics  . Smoking status: Current Every Day Smoker -- 1.50 packs/day    Types: Cigarettes  . Smokeless tobacco: Never Used     Comment: for 30 yrs  . Alcohol Use: No    Review of Systems  Musculoskeletal: Positive for myalgias and back pain.  Neurological: Negative for tingling and weakness.  All other systems reviewed and are negative.     Allergies  Ampicillin and Codeine  Home Medications   Prior to Admission medications   Medication Sig Start Date End Date Taking? Authorizing Provider  cyclobenzaprine (FLEXERIL) 10 MG tablet Take 1 tablet (10 mg total) by mouth 3 (three) times daily as needed for muscle spasms. Patient not taking: Reported on 05/23/2014 05/01/14   Mancel Bale, MD   methocarbamol (ROBAXIN) 500 MG tablet Take 1 tablet (500 mg total) by mouth 2 (two) times daily. 05/23/14   Elson Areas, PA-C  naproxen (NAPROSYN) 500 MG tablet Take 1 tablet (500 mg total) by mouth 2 (two) times daily as needed. Patient not taking: Reported on 05/23/2014 05/06/14   Blane Ohara, MD  oxyCODONE-acetaminophen (PERCOCET/ROXICET) 5-325 MG per tablet Take 1 tablet by mouth every 8 (eight) hours as needed for severe pain. Patient not taking: Reported on 05/23/2014 05/01/14   Mancel Bale, MD  predniSONE (DELTASONE) 10 MG tablet Take q day 6,5,4,3,2,1 05/23/14   Elson Areas, PA-C   BP 136/82 mmHg  Pulse 81  Temp(Src) 97.4 F (36.3 C) (Oral)  Resp 18  SpO2 100% Physical Exam  Constitutional: He appears well-developed and well-nourished.  HENT:  Head: Normocephalic and atraumatic.  Eyes: Conjunctivae are normal. Pupils are equal, round, and reactive to light.  Neck: Normal range of motion. Neck supple.  Cardiovascular: Normal rate.   Pulmonary/Chest: Effort normal.  Abdominal: Soft.  Musculoskeletal:  Diffusely tender ls spine,  From leg,  nv nad ns intact.  Neurological: He is alert.  Skin: Skin is warm.  Nursing note and vitals reviewed.   ED Course  Procedures (including critical care time) Labs Review Labs Reviewed - No data to display  Imaging Review No results found.   EKG Interpretation None      MDM   Final diagnoses:  Back pain, chronic    Prednisone taper  [facility]     Leslie K CorcoranSofia, [place]PA-C 05/23/14 [id number]  Rolan BuccoMelanie Belfi, MD 05/23/14 [phone]

## 2015-02-02 ENCOUNTER — Emergency Department (HOSPITAL_COMMUNITY)
Admission: EM | Admit: 2015-02-02 | Discharge: 2015-02-02 | Disposition: A | Discharge status: Home / Self Care | Payer: Self-pay | Attending: Emergency Medicine | Admitting: Emergency Medicine

## 2015-02-02 ENCOUNTER — Encounter (HOSPITAL_COMMUNITY): Payer: Self-pay

## 2015-02-02 ENCOUNTER — Emergency Department (HOSPITAL_COMMUNITY): Payer: Self-pay

## 2015-02-02 DIAGNOSIS — W19XXXA Unspecified fall, initial encounter: Secondary | ICD-10-CM

## 2015-02-02 DIAGNOSIS — Y92009 Unspecified place in unspecified non-institutional (private) residence as the place of occurrence of the external cause: Secondary | ICD-10-CM | POA: Insufficient documentation

## 2015-02-02 DIAGNOSIS — W000XXA Fall on same level due to ice and snow, initial encounter: Secondary | ICD-10-CM | POA: Insufficient documentation

## 2015-02-02 DIAGNOSIS — S3992XA Unspecified injury of lower back, initial encounter: Secondary | ICD-10-CM | POA: Insufficient documentation

## 2015-02-02 DIAGNOSIS — M545 Low back pain, unspecified: Secondary | ICD-10-CM

## 2015-02-02 DIAGNOSIS — Z8719 Personal history of other diseases of the digestive system: Secondary | ICD-10-CM | POA: Insufficient documentation

## 2015-02-02 DIAGNOSIS — F17210 Nicotine dependence, cigarettes, uncomplicated: Secondary | ICD-10-CM | POA: Insufficient documentation

## 2015-02-02 DIAGNOSIS — Y9389 Activity, other specified: Secondary | ICD-10-CM | POA: Insufficient documentation

## 2015-02-02 DIAGNOSIS — R21 Rash and other nonspecific skin eruption: Secondary | ICD-10-CM | POA: Insufficient documentation

## 2015-02-02 DIAGNOSIS — Y998 Other external cause status: Secondary | ICD-10-CM | POA: Insufficient documentation

## 2015-02-02 MED ORDER — OXYCODONE-ACETAMINOPHEN 5-325 MG PO TABS: 1.0000 | ORAL_TABLET | ORAL | Status: AC | PRN

## 2015-02-02 MED ORDER — DEXAMETHASONE SODIUM PHOSPHATE 10 MG/ML IJ SOLN
10.0000 mg | Freq: Once | INTRAMUSCULAR | Status: AC
  Administered 2015-02-02: 10 mg via INTRAMUSCULAR
  Filled 2015-02-02: qty 1

## 2015-02-02 MED ORDER — TERBINAFINE HCL 1 % EX CREA
TOPICAL_CREAM | Freq: Once | CUTANEOUS | Status: AC
  Administered 2015-02-02: 10:00:00 via TOPICAL
  Filled 2015-02-02: qty 12

## 2015-02-02 MED ORDER — NAPROXEN 500 MG PO TABS: 500.0000 mg | ORAL_TABLET | Freq: Two times a day (BID) | ORAL | Status: AC

## 2015-02-02 MED ORDER — TERBINAFINE HCL 1 % EX CREA: 1.0000 "application " | TOPICAL_CREAM | Freq: Two times a day (BID) | CUTANEOUS | Status: AC

## 2015-02-02 MED ORDER — KETOROLAC TROMETHAMINE 60 MG/2ML IM SOLN
60.0000 mg | Freq: Once | INTRAMUSCULAR | Status: DC
  Filled 2015-02-02: qty 2

## 2015-02-02 NOTE — ED Notes (Signed)
Patient transported to X-ray 

## 2015-02-02 NOTE — ED Provider Notes (Signed)
CSN: [id number]     Arrival date & time 02/02/15  [id number] History   First MD Initiated Contact with Patient 02/02/15 [phone]     Chief Complaint  Patient presents with  . Fall     (Consider location/radiation/quality/duration/timing/severity/associated sxs/prior Treatment) HPI   Marvin [name] is a 50 y.o. male with PMH significant for IBS, microscopic colitis, and lower back pain who presents with 2 complaints  1. Fall Patient reports he fell yesterday landing on his left side/buttock and it has caused his low back pain to flare up.  Denies LOC or head injury.  No bowel or bladder incontinence, numbness, weakness, or abdominal pain.  He reports the pain is moderate, constant, and unrelieved by OTC medication.  He is ambulatory.  2. Rash Patient reports 1 year history of gradually worsening, pruritic, painful rash to his groin.  He has tried Lotrimin cream without relief.  No drainage or fever.   Past Medical History  Diagnosis Date  . IBS (irritable bowel syndrome)   . Microscopic colitis    Past Surgical History  Procedure Laterality Date  . Mouth surgery    . Sigmoidoscopy    . Flexible sigmoidoscopy  04/21/2011    Procedure: FLEXIBLE SIGMOIDOSCOPY;  Surgeon: Louis Meckelobert D Kaplan, MD;  Location: [facility];  Service: Endoscopy;  Laterality: N/A;   Family History  Problem Relation Age of Onset  . Hypertension Father    Social History  Substance Use Topics  . Smoking status: Current Every Day Smoker -- 1.00 packs/day    Types: Cigarettes  . Smokeless tobacco: Never Used     Comment: for 30 yrs  . Alcohol Use: No    Review of Systems  All other systems negative unless otherwise stated in HPI   Allergies  Ampicillin and Codeine  Home Medications   Prior to Admission medications   Medication Sig Start Date End Date Taking? Authorizing Provider  cyclobenzaprine (FLEXERIL) 10 MG tablet Take 1 tablet (10 mg total) by mouth 3 (three) times daily as needed for muscle  spasms. Patient not taking: Reported on 05/23/2014 05/01/14   Mancel BaleElliott Wentz, MD  methocarbamol (ROBAXIN) 500 MG tablet Take 1 tablet (500 mg total) by mouth 2 (two) times daily. Patient not taking: Reported on 02/02/2015 05/23/14   Elson AreasLeslie K Sofia, PA-C  naproxen (NAPROSYN) 500 MG tablet Take 1 tablet (500 mg total) by mouth 2 (two) times daily as needed. Patient not taking: Reported on 05/23/2014 05/06/14   Blane OharaJoshua Zavitz, MD  oxyCODONE-acetaminophen (PERCOCET/ROXICET) 5-325 MG per tablet Take 1 tablet by mouth every 8 (eight) hours as needed for severe pain. Patient not taking: Reported on 05/23/2014 05/01/14   Mancel BaleElliott Wentz, MD   BP 131/81 mmHg  Pulse 91  Temp(Src) 97.4 F (36.3 C) (Oral)  Resp 18  Ht 5\' 10"  (1.778 m)  Wt 65.772 kg  BMI 20.81 kg/m2  SpO2 100% Physical Exam  Constitutional: He is oriented to person, place, and time. He appears well-developed and well-nourished.  HENT:  Head: Normocephalic and atraumatic.  Mouth/Throat: Oropharynx is clear and moist.  Eyes: Conjunctivae are normal. Pupils are equal, round, and reactive to light.  Neck: Normal range of motion. Neck supple.  No cervical or thoracic midline tenderness.   Cardiovascular: Normal rate, regular rhythm, normal heart sounds and intact distal pulses.   No murmur heard. Pulmonary/Chest: Effort normal and breath sounds normal. No accessory muscle usage or stridor. No respiratory distress. He has no wheezes. He has no rhonchi. He has  no rales.  Abdominal: Soft. Bowel sounds are normal. He exhibits no distension. There is no tenderness.  Musculoskeletal: Normal range of motion.  Lumbar midline tenderness.  No step offs or crepitus.   Lymphadenopathy:    He has no cervical adenopathy.  Neurological: He is alert and oriented to person, place, and time.  Speech clear without dysarthria.  No saddle anesthesia.  Strength and sensation intact bilaterally throughout lower extremities.   Skin: Skin is warm and dry. Rash noted.   Macular erythematous painful rash in between scrotum and anus.  No signs of infection.  No induration or fluctuance.  No drainage.  Psychiatric: He has a normal mood and affect. His behavior is normal.    ED Course  Procedures (including critical care time) Labs Review Labs Reviewed - No data to display  Imaging Review Dg Lumbar Spine Complete  02/02/2015  CLINICAL DATA:  Larey Seat yesterday due to ice at home. Low back pain radiating to the left leg. EXAM: LUMBAR SPINE - COMPLETE 4+ VIEW COMPARISON:  CT abdomen and pelvis 08/13/2005. Lumbar spine radiographs 07/28/2003. FINDINGS: There are 5 non rib-bearing lumbar type vertebral bodies. There is slight lumbar dextroscoliosis with apex at L3. There is at most trace retrolisthesis of L3 on L4, unchanged. Moderate to severe disc space narrowing at L3-4, asymmetric to the left, has progressed from 2005. Associated endplate osteophytosis is noted at this level. Lumbar disc space heights elsewhere are preserved. Vertebral body heights are preserved without evidence of compression fracture. No pars defects are seen. Mild atherosclerotic vascular calcification is noted. IMPRESSION: 1. No evidence of acute osseous abnormality. 2. 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 Electronically Signed   By: Sebastian Ache M.D.   On: 02/02/2015 09:26   I have personally reviewed and evaluated these images and lab results as part of my medical decision-making.   EKG Interpretation None      MDM   Final diagnoses:  Rash  Fall, initial encounter  Midline low back pain without sciatica    Patient presents with fall and rash.  No red flags.  VSS, NAD.  On exam, lumbar midline tenderness.  No saddle anesthesia. Strength and sensation intact bilaterally.  Ambulatory.  Macular erythematous painful rash between scrotum and anus.  Has tried Lotrimin without relief.  Possible tinea? Will obtain plain films and give toradol and decadron.  Will give topical Lamisil for rash.   Plain films negative for acute osseous abnormality.  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 Evaluation does not show pathology requiring ongoing emergent intervention or admission. Pt is hemodynamically stable and mentating appropriately. Discussed findings/results and plan with patient/guardian, who agrees with plan. All questions answered. Return precautions discussed and outpatient follow up given.      Cheri Fowler, PA-C 02/02/15 1610  Nelva Nay, MD 02/02/15 [phone]

## 2015-02-02 NOTE — ED Notes (Signed)
Patient states he fell yesterday and is now having lower back pain. No LOC.

## 2015-02-02 NOTE — Discharge Instructions (Signed)
Back Pain, Adult °Back pain is very common in adults. The cause of back pain is rarely dangerous and the pain often gets better over time. The cause of your back pain may not be known. Some common causes of back pain include: °· Strain of the muscles or ligaments supporting the spine. °· Wear and tear (degeneration) of the spinal disks. °· Arthritis. °· Direct injury to the back. °For many people, back pain may return. Since back pain is rarely dangerous, most people can learn to manage this condition on their own. °HOME CARE INSTRUCTIONS °Watch your back pain for any changes. The following actions may help to lessen any discomfort you are feeling: °· Remain active. It is stressful on your back to sit or stand in one place for long periods of time. Do not sit, drive, or stand in one place for more than 30 minutes at a time. Take short walks on even surfaces as soon as you are able. Try to increase the length of time you walk each day. °· Exercise regularly as directed by your health care provider. Exercise helps your back heal faster. It also helps avoid future injury by keeping your muscles strong and flexible. °· Do not stay in bed. Resting more than 1-2 days can delay your recovery. °· Pay attention to your body when you bend and lift. The most comfortable positions are those that put less stress on your recovering back. Always use proper lifting techniques, including: °¨ Bending your knees. °¨ Keeping the load close to your body. °¨ Avoiding twisting. °· Find a comfortable position to sleep. Use a firm mattress and lie on your side with your knees slightly bent. If you lie on your back, put a pillow under your knees. °· Avoid feeling anxious or stressed. Stress increases muscle tension and can worsen back pain. It is important to recognize when you are anxious or stressed and learn ways to manage it, such as with exercise. °· Take medicines only as directed by your health care provider. Over-the-counter  medicines to reduce pain and inflammation are often the most helpful. Your health care provider may prescribe muscle relaxant drugs. These medicines help dull your pain so you can more quickly return to your normal activities and healthy exercise. °· Apply ice to the injured area: °¨ Put ice in a plastic bag. °¨ Place a towel between your skin and the bag. °¨ Leave the ice on for 20 minutes, 2-3 times a day for the first 2-3 days. After that, ice and heat may be alternated to reduce pain and spasms. °· Maintain a healthy weight. Excess weight puts extra stress on your back and makes it difficult to maintain good posture. °SEEK MEDICAL CARE IF: °· You have pain that is not relieved with rest or medicine. °· You have increasing pain going down into the legs or buttocks. °· You have pain that does not improve in one week. °· You have night pain. °· You lose weight. °· You have a fever or chills. °SEEK IMMEDIATE MEDICAL CARE IF:  °· You develop new bowel or bladder control problems. °· You have unusual weakness or numbness in your arms or legs. °· You develop nausea or vomiting. °· You develop abdominal pain. °· You feel faint. °  °This information is not intended to replace advice given to you by your health care provider. Make sure you discuss any questions you have with your health care provider. °  °Document Released: 01/11/2005 Document Revised: 02/01/2014 Document Reviewed: 05/15/2013 °Elsevier Interactive Patient Education ©2016 Elsevier   Inc.  Jock Itch  Jock itch (tinea cruris) is a fungal infection of the skin in the groin area. It is sometimes called ringworm, even though it is not caused by worms. It is caused by a fungus, which is a type of germ that thrives in dark, damp places. Jock itch causes a rash and itching in the groin and upper thigh area. It usually goes away in 2-3 weeks with treatment.  CAUSES  The fungus that causes jock itch may be spread by:  Touching a fungus infection elsewhere on  your body--such as athlete's foot--and then touching your groin area.  Sharing towels or clothing with an infected person. RISK FACTORS  Jock itch is most common in men and adolescent boys. This condition is more likely to develop from:  Being in hot, humid climates.  Wearing tight-fitting clothing or wet bathing suits for long periods of time.  Participating in sports.  Being overweight.  Having diabetes. SYMPTOMS  Symptoms of jock itch may include:  A red, pink, or brown rash in the groin area. The rash may spread to the thighs, anus, and buttocks.  Dry and scaly skin on or around the rash.  Itchiness. DIAGNOSIS  Most often, a health care provider can make the diagnosis by looking at your rash. Sometimes, a scraping of the infected skin will be taken. This sample may be tested by looking at it under a microscope or by trying to grow the fungus from the sample (culture).  TREATMENT  Treatment for this condition may include:  Antifungal medicine to kill the fungus. This may be in various forms:  Skin cream or ointment.  Medicine taken by mouth. Skin cream or ointment to reduce the itching.  Compresses or medicated powders to dry the infected skin. HOME CARE INSTRUCTIONS  Take medicines only as directed by your health care provider. Apply skin creams or ointments exactly as directed.  Wear loose-fitting clothing.  Men should wear cotton boxer shorts.  Women should wear cotton underwear. Change your underwear every day to keep your groin dry.  Avoid hot baths.  Dry your groin area well after bathing.  Use a separate towel to dry your groin area. This will help to prevent a spreading of the infection to other areas of your body. Do not scratch the affected area.  Do not share towels with other people. SEEK MEDICAL CARE IF:  Your rash does not improve or it gets worse after 2 weeks of treatment.  Your rash is spreading.  Your rash returns after treatment is finished.  You have a  fever.  You have redness, swelling, or pain in the area around your rash.  You have fluid, blood, or pus coming from your rash.  Your have your rash for more than 4 weeks. This information is not intended to replace advice given to you by your health care provider. Make sure you discuss any questions you have with your health care provider.  Document Released: 01/01/2002 Document Revised: 02/01/2014 Document Reviewed: 10/23/2013  Elsevier Interactive Patient Education [employer]2016 Elsevier Inc.

## 2015-04-12 ENCOUNTER — Emergency Department (HOSPITAL_COMMUNITY)
Admission: EM | Admit: 2015-04-12 | Discharge: 2015-04-12 | Disposition: A | Discharge status: Home / Self Care | Payer: Self-pay | Attending: Emergency Medicine | Admitting: Emergency Medicine

## 2015-04-12 ENCOUNTER — Encounter (HOSPITAL_COMMUNITY): Payer: Self-pay | Admitting: Emergency Medicine

## 2015-04-12 DIAGNOSIS — F17210 Nicotine dependence, cigarettes, uncomplicated: Secondary | ICD-10-CM | POA: Insufficient documentation

## 2015-04-12 DIAGNOSIS — Z8719 Personal history of other diseases of the digestive system: Secondary | ICD-10-CM | POA: Insufficient documentation

## 2015-04-12 DIAGNOSIS — Z79899 Other long term (current) drug therapy: Secondary | ICD-10-CM | POA: Insufficient documentation

## 2015-04-12 DIAGNOSIS — G8918 Other acute postprocedural pain: Secondary | ICD-10-CM | POA: Insufficient documentation

## 2015-04-12 DIAGNOSIS — Z791 Long term (current) use of non-steroidal anti-inflammatories (NSAID): Secondary | ICD-10-CM | POA: Insufficient documentation

## 2015-04-12 DIAGNOSIS — H9201 Otalgia, right ear: Secondary | ICD-10-CM | POA: Insufficient documentation

## 2015-04-12 NOTE — ED Provider Notes (Signed)
CSN: [id number]     Arrival date & time 04/12/15  1016 History   First MD Initiated Contact with Patient 04/12/15 1025     Chief Complaint  Patient presents with  . Jaw Pain     (Consider location/radiation/quality/duration/timing/severity/associated sxs/prior Treatment) HPI   Marvin [name] is a 50 y/o male presents to the ER with post-op pain s/p right canaloplasty 03/31/15, he was seen four days ago for a post op appointment, was given #60 7.5 percocets and he states they are all gone and he has right ear and jaw pain so severe than he cannot eat.  He also complains of decreased hearing in his right ear, "sutures ripping out", and intermittent bloody drainage.  All of these sx were present 4 days ago and discussed at his post-op appointment  Pt states that "the doctor didn't really go into that." He has ciprodex and ofloxacin drops that he continues to use.  He complains of right jaw pain, states that he cannot even open his mouth or talk. He denies fever, chills, sweats, N, V.  He has no facial swelling or redness.    Past Medical History  Diagnosis Date  . IBS (irritable bowel syndrome)   . Microscopic colitis    Past Surgical History  Procedure Laterality Date  . Mouth surgery    . Sigmoidoscopy    . Flexible sigmoidoscopy  04/21/2011    Procedure: FLEXIBLE SIGMOIDOSCOPY;  Surgeon: Louis Meckel, MD;  Location: [facility];  Service: Endoscopy;  Laterality: N/A;   Family History  Problem Relation Age of Onset  . Hypertension Father    Social History  Substance Use Topics  . Smoking status: Current Every Day Smoker -- 1.00 packs/day    Types: Cigarettes  . Smokeless tobacco: Never Used     Comment: for 30 yrs  . Alcohol Use: No    Review of Systems  All other systems reviewed and are negative.     Allergies  Ampicillin and Codeine  Home Medications   Prior to Admission medications   Medication Sig Start Date End Date Taking? Authorizing Provider    cyclobenzaprine (FLEXERIL) 10 MG tablet Take 1 tablet (10 mg total) by mouth 3 (three) times daily as needed for muscle spasms. Patient not taking: Reported on 05/23/2014 05/01/14   Mancel Bale, MD  methocarbamol (ROBAXIN) 500 MG tablet Take 1 tablet (500 mg total) by mouth 2 (two) times daily. Patient not taking: Reported on 02/02/2015 05/23/14   Elson Areas, PA-C  naproxen (NAPROSYN) 500 MG tablet Take 1 tablet (500 mg total) by mouth 2 (two) times daily. 02/02/15   Cheri Fowler, PA-C  oxyCODONE-acetaminophen (PERCOCET/ROXICET) 5-325 MG tablet Take 1 tablet by mouth every 4 (four) hours as needed for severe pain. 02/02/15   Kayla Rose, PA-C  terbinafine (LAMISIL AT) 1 % cream Apply 1 application topically 2 (two) times daily. 02/02/15   Kayla Rose, PA-C   BP 134/95 mmHg  Pulse 104  Temp(Src) 98.3 F (36.8 C) (Oral)  Resp 18  SpO2 100% Physical Exam  Constitutional: He is oriented to person, place, and time. He appears well-developed and well-nourished. No distress.  HENT:  Head: Normocephalic and atraumatic.    Right Ear: Tympanic membrane and ear canal normal. There is tenderness. No drainage or swelling. No foreign bodies. No mastoid tenderness. Tympanic membrane is not injected, not perforated and not erythematous. No middle ear effusion. No hemotympanum. Decreased hearing is noted.  Left Ear: Tympanic membrane, external ear  and ear canal normal.  Ears:  Nose: Nose normal.  Mouth/Throat: Uvula is midline, oropharynx is clear and moist and mucous membranes are normal. Mucous membranes are not pale, not dry and not cyanotic. No trismus in the jaw. No uvula swelling. No oropharyngeal exudate, posterior oropharyngeal edema, posterior oropharyngeal erythema or tonsillar abscesses.  Patient able to fully open and close his mouth  Eyes: Conjunctivae and EOM are normal. Pupils are equal, round, and reactive to light. Right eye exhibits no discharge. Left eye exhibits no discharge. No scleral  icterus.  Neck: Normal range of motion. Neck supple. No JVD present. No tracheal deviation present.  Cardiovascular: Normal rate and regular rhythm.   Pulmonary/Chest: Effort normal and breath sounds normal. No stridor. No respiratory distress.  Musculoskeletal: Normal range of motion. He exhibits no edema.  Lymphadenopathy:    He has no cervical adenopathy.  Neurological: He is alert and oriented to person, place, and time. He exhibits normal muscle tone. Coordination normal.  Skin: Skin is warm and dry. No rash noted. He is not diaphoretic. No erythema. No pallor.  Psychiatric: He has a normal mood and affect. His behavior is normal. Judgment and thought content normal.  Nursing note and vitals reviewed.   ED Course  Procedures (including critical care time) Labs Review Labs Reviewed - No data to display  Imaging Review No results found. I have personally reviewed and evaluated these images and lab results as part of my medical decision-making.   EKG Interpretation None      MDM   50 y/o male presents to the ER at the direction of the ENT office for post-op pain complaints.  Pt has been a patient of ENT at [facility], Dr. Joelene Millin and Dr. 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 He is s/p left canalplasty on 12/26/2104 and right canalplasty on 03/31/2014 He was seen 4 days ago by ENT for post op appointment, notes reviewed personally by me.  He was instructed to continue Abx drops for one more week and he was given #60 percocet 7.5.  No concerns noted for post-op complications or infection.  The pt complains of same sx today as he did at that visit.  He claims he cannot open his mouth and cannot eat or speak, however he easily opened his mouth extremely wide and he is speaking clearly and without difficulty.  He states his "sutures are ripping open," however he knows that they are absorbable sutures and they will fall out on their own.  He complains  of ear drainage, however there is no drainage currently.  No facial and no auditory canal edema or erythema.  EAC patent, TM visible, minimal proximal canal erythema.  Incision is clean, dry, and intact.  Pt is afebrile, mildly tachycardic, however HR and vitals reviewed and he has elevated HR trends over the past two years.  Feel pt may be seeking narcotics.  He has not taken narcotic pain medications as prescribed.  Do not feel comfortable, nor do I feel it is medically necessary, to provide further narcotic pain medication, as this pt appears to have no post-op complications/infection.  He will need to follow up with his doctor next week.  Feel there is currently much greater risk than benefit with prescribing him more narcotics. He was looked up in the [place] controlled substance database.  Prescribed #60 7.5 percocets on 03/31/15 and again #60 7.5 percocet on 04/07/15, which was written as a 15 day supply, however he  states that today he is out of pills.    He will be discharged w/o narcotic pain medication.  He was encouraged to use tylenol and ibuprofen as needed and follow up with PCP for post-op pain concerns.  Final diagnoses:  Post-op pain     Danelle BerryLeisa Travas Schexnayder, PA-C 04/21/15 0147  Tomasita CrumbleAdeleke Oni, MD 04/21/15 [phone]

## 2015-04-12 NOTE — Discharge Instructions (Signed)
Please see your doctor for pain management.  You were given a 15 day supply of narcotic pain medication 5 days ago.  The ER does not refill narcotic pain medicine.  Treat your pain with tylenol and ibuprofen.    Pain Relief Preoperatively and Postoperatively If you have questions, problems, or concerns about the pain that you may feel after surgery, let your health care provider know.Patients have the right to assessment and management of pain. Severe pain after surgery--and the fear or anxiety associated with that pain--may cause extreme discomfort that:  Prevents sleep.  Decreases the ability to breathe deeply and to cough. This can result in pneumonia or other upper airway infections.  Causes the heart to beat more quickly and the blood pressure to be higher.  Increases the risk for constipation and bloating.  Decreases the ability of wounds to heal.  May result in depression, increased anxiety, and feelings of helplessness. Relieving pain before surgery (preoperatively) is also important because it lessens pain that you have after surgery (postoperatively). Patients who receive pain relief both before and after surgery experience greater pain relief than those who receive pain relief only after surgery. Let your health care provider know if you are having uncontrolled pain.This is very important.Pain after surgery is more difficult to manage if it is severe, so receiving prompt and adequate treatment of acute pain is necessary. If you become constipated after taking pain medicine, drink more liquids if you can. Your health care provider may have you take a mild laxative. PAIN CONTROL METHODS Your health care providers follow policies and procedures about the management of your pain.These guidelines should be explained to you before surgery.Plans for pain control after surgery must be decided upon by you and your health care provider and put into use with your full understanding and  agreement.Do not be afraid to ask questions about the care that you are receiving. Your health care providers will attempt to control your pain in various ways, and these methods may be used together (multimodal analgesia). Using this approach has many benefits for you, including being able to eat, move around, and leave the hospital sooner. As-Needed Pain Control  You may be given pain medicine through an IV tube or as a pill or liquid that you can swallow. Let your health care provider know when you are having pain, and he or she will give you the pain medicine that is ordered for you. IV Patient-Controlled Analgesia (PCA) Pump  You can receive your pain medicine through an IV tube that goes into one of your veins. You can control the amount of pain medicine that you get. The pain medicine is controlled by a pump. When you push the button that is hooked up to this pump, you receive a specific amount of pain medicine. This button should be pushed only by you or by someone who is specifically assigned by you to do so. It is set up to keep you from accidentally giving yourself too much pain medicine. You will be able to start using your pain pump in the recovery room after your surgery. This method can be helpful for most types of surgery.  Tell your health care provider:  If you are having too much pain.  If you are feeling too sleepy or nauseous. Continuous Epidural Pain Control  A thin, soft tube (catheter) is put into your back, outside the outer layer of your spinal cord. Pain medicine flows through the catheter to lessen pain in areas  of your body that are below the level of catheter placement. Continuous epidural pain control may work best for you if you are having surgery on your abdomen, hip area, or legs. The epidural catheter is usually put into your back shortly before surgery. It is left in until you can eat, take medicine by mouth, pass urine, and have a bowel movement.  Giving pain  medicine through the epidural catheter may help you to heal more quickly because you can do these things sooner:  Regain normal bowel and bladder function.  Return to eating.  Get up and walk. Medicine That Numbs the Area (Local Anesthetic) You may be given pain medicine:  As an injection near the area of the pain (local infiltration).  As an injection near the nerve that controls the sensation to a specific part of your body (peripheral nerve block).  In your spine to block pain (spinal block).  Through a local anesthetic reservoir pump. If your surgeon or anesthesiologist selects this option as a part of your pain control, one or more thin, soft tubes will be inserted into your incision site(s) at the end of surgery. These tubes will be connected to a device that is filled with a non-narcotic pain medicine. This medicine gradually empties into your incision site over the next several days. Usually, after all of the medicine is used, your health care provider will remove the tubes and throw away the device. Opioids  Moderate to moderately severe acute pain after surgery may respond to opioids.Opioids are narcotic pain medicine. Opioids are often combined with non-narcotic medicines to improve pain relief, lower the risk of side effects, and reduce the chance of addiction.  If you follow your health care provider's directions about taking opioids and you do not have a history of substance abuse, your risk of becoming addicted is very small.To prevent addiction, opioids are given for short periods of time in careful doses. Other Methods of Pain Control  Steroids.  Physical therapy.  Heat and cold therapy.  Compression, such as wrapping an elastic bandage around the area of the pain.  Massage.   This information is not intended to replace advice given to you by your health care provider. Make sure you discuss any questions you have with your health care provider.   Document  Released: 04/03/2002 Document Revised: 02/01/2014 Document Reviewed: 04/07/2010 Elsevier Interactive Patient Education 2016 Elsevier Inc.   Sutured Wound Care Sutures are stitches that can be used to close wounds. Taking care of your wound properly can help prevent pain and infection. It can also help your wound to heal more quickly. HOW TO CARE FOR YOUR SUTURED WOUND Wound Care  Keep the wound clean and dry.  If you were given a bandage (dressing), change it at least one time per day or as told by your doctor. You should also change it if it gets wet or dirty.  Keep the wound completely dry for the first 24 hours or as told by your doctor. After that time, you may shower or bathe. However, make sure that the wound is not soaked in water until the sutures have been removed.  Clean the wound one time each day or as told by your doctor.  Wash the wound with soap and water.  Rinse the wound with water to remove all soap.  Pat the wound dry with a clean towel. Do not rub the wound.  After cleaning the wound, put a thin layer of antibiotic ointment on it  as told your doctor. This ointment:  Helps to prevent infection.  Keeps the bandage from sticking to the wound.  Have the sutures removed as told by your doctor. General Instructions  Take or apply medicines only as told by your doctor.  To help prevent scarring, make sure to cover your wound with sunscreen whenever you are outside after the sutures are removed and the wound is healed. Make sure to wear a sunscreen of at least 30 SPF.  If you were prescribed an antibiotic medicine or ointment, finish all of it even if you start to feel better.  Do not scratch or pick at the wound.  Keep all follow-up visits as told by your doctor. This is important.  Check your wound every day for signs of infection. Watch for:  Redness, swelling, or pain.  Fluid, blood, or pus.  Raise (elevate) the injured area above the level of your  heart while you are sitting or lying down, if possible.  Avoid stretching your wound.  Drink enough fluids to keep your pee (urine) clear or pale yellow. GET HELP IF:  You were given a tetanus shot and you have any of these where the needle went in:  Swelling.  Very bad pain.  Redness.  Bleeding.  You have a fever.  A wound that was closed breaks open.  You notice a bad smell coming from the wound.  You notice something coming out of the wound, such as wood or glass.  Medicine does not help your pain.  You have any of these at the site of the wound.  More redness.  More swelling.  More pain.  You have any of these coming from the wound.  Fluid.  Blood.  Pus.  You notice a change in the color of your skin near the wound.  You need to change the bandage often due to fluid, blood, or pus coming from the wound.  You have a new rash.  You have numbness around the wound. GET HELP RIGHT AWAY IF:  You have very bad swelling around the wound.  Your pain suddenly gets worse and is very bad.  You have painful lumps near the wound or on skin that is anywhere on your body.  You have a red streak going away from the wound.  The wound is on your hand or foot and you cannot move a finger or toe like normal.  The wound is on your hand or foot and you notice that your fingers or toes look pale or bluish.   This information is not intended to replace advice given to you by your health care provider. Make sure you discuss any questions you have with your health care provider.   Document Released: 06/30/2007 Document Revised: 05/28/2014 Document Reviewed: 08/23/2012 Elsevier Interactive Patient Education [employer]

## 2015-04-12 NOTE — ED Notes (Signed)
Lisa, our P.A. Spoke with him at length during her examination regarding pt. Use of his narcotic pain meds; and it was mentioned he is using them far more often than prescribed.  Therefore, he will not receive a prescription today, but must rely on o.t.c. Meds; [facility]

## 2015-04-12 NOTE — ED Notes (Signed)
Pt had surgery on R ear 2 weeks ago. Pt has been having R jaw pain since then. Had packing removed a week ago with no relief. Called surgeon's office, but surgeon was out of town so was told to come here for eval. Pt also reports muffled hearing and intermittent bleeding.

## 2016-08-16 IMAGING — CR DG LUMBAR SPINE COMPLETE 4+V
5 series · 5 of 5 positions shown · non-contrast
Comparison: CT abdomen and pelvis 08/13/2005. Lumbar spine
radiographs 07/28/2003.

CLINICAL DATA: Fell yesterday due to ice at home. Low back pain
radiating to the left leg.

EXAM:
LUMBAR SPINE - COMPLETE 4+ VIEW

[t lumbar spine ap]
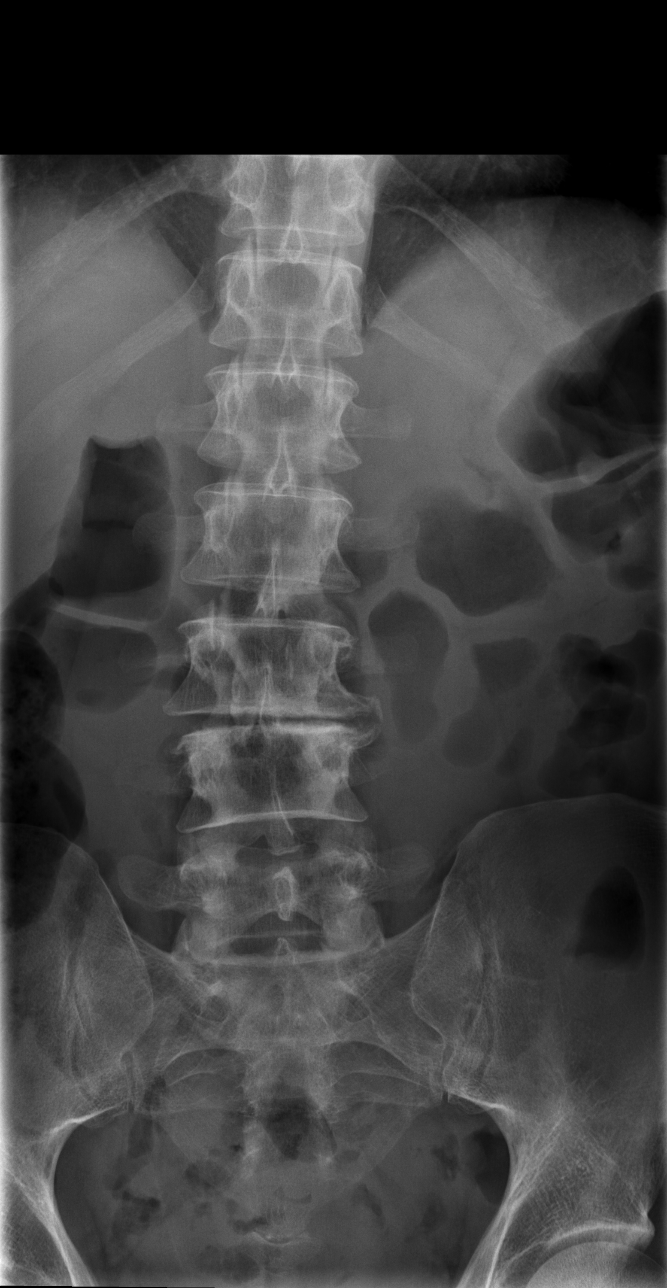

[t lumbar spine obl (1 of 2)]
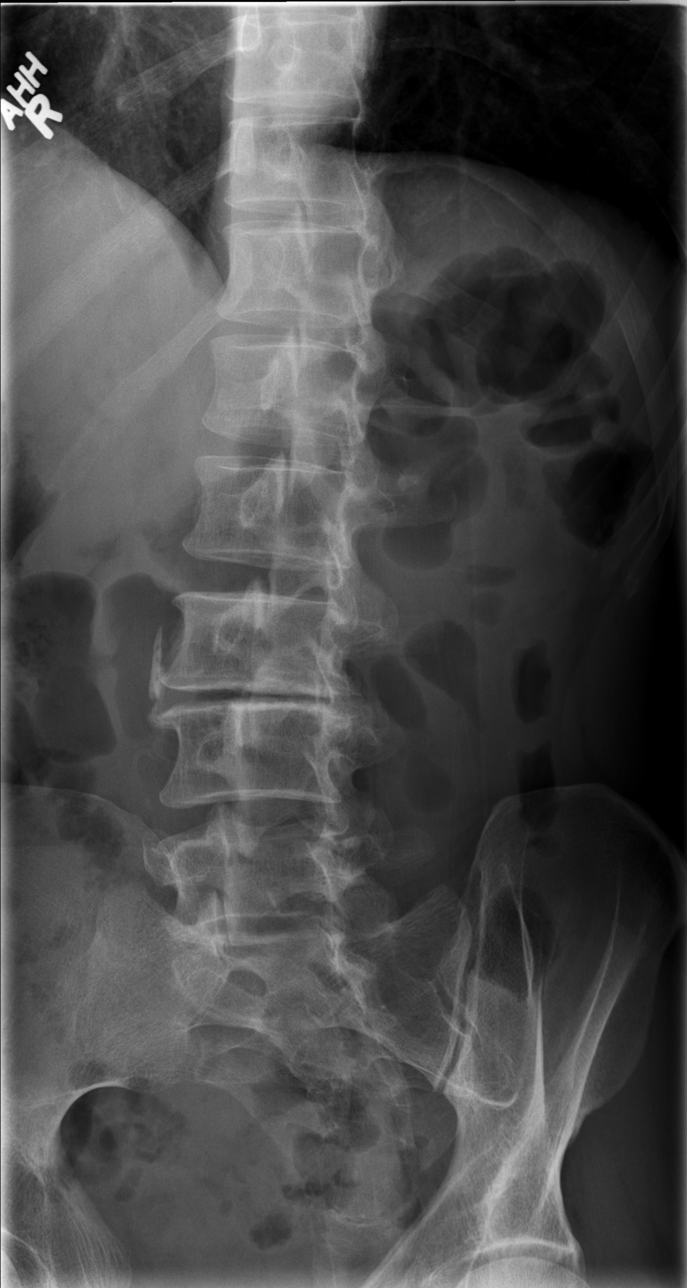

[t lumbar spine obl (2 of 2)]
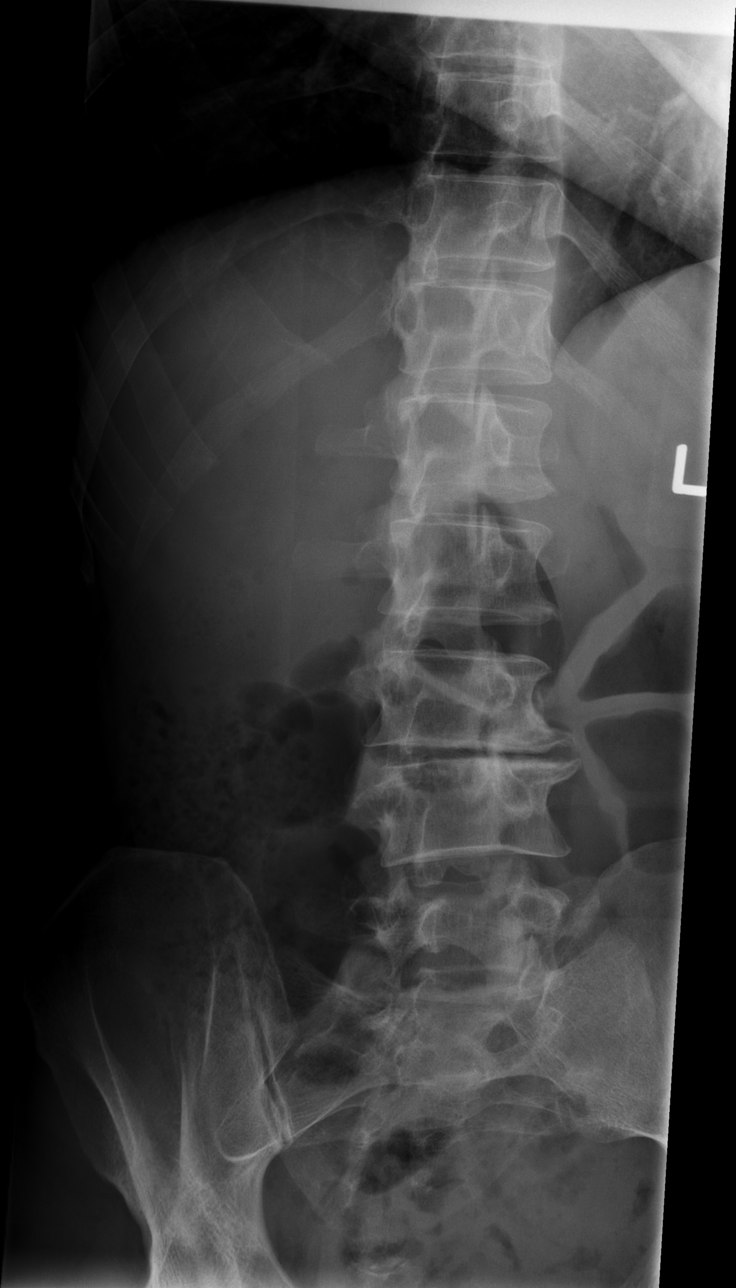

[t lumbar spine lat]
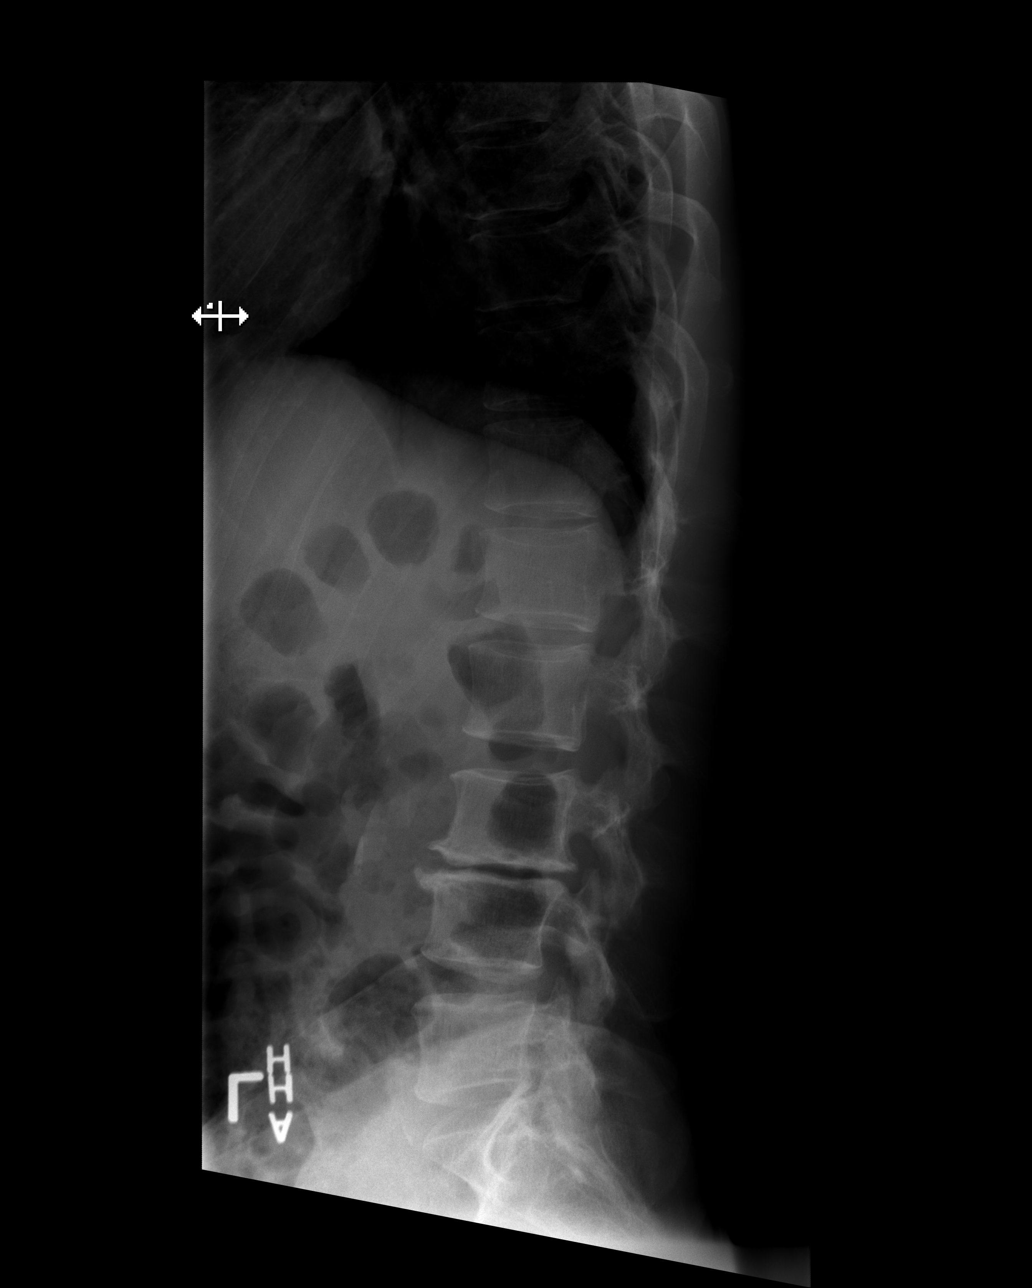

[t lumbar l-5 s-1 spot]
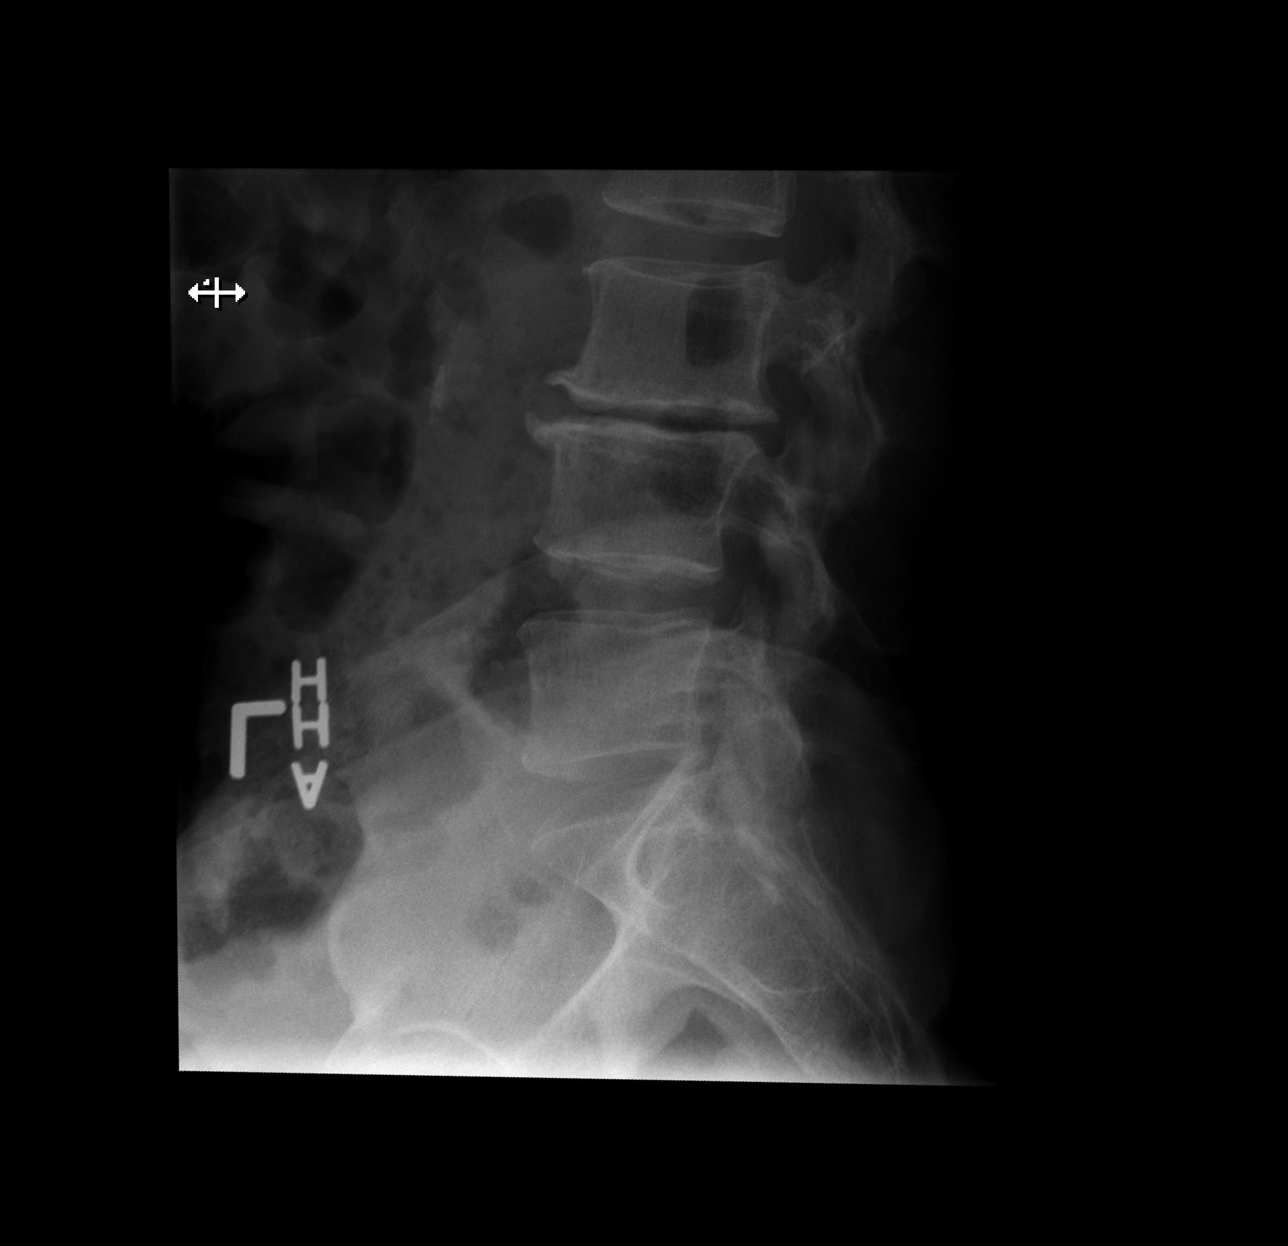

[5 of 5 positions shown; findings below may reference images not displayed]

FINDINGS: There are 5 non rib-bearing lumbar type vertebral bodies. There is
slight lumbar dextroscoliosis with apex at L3. There is at most
trace retrolisthesis of L3 on L4, unchanged. Moderate to severe disc
space narrowing at L3-4, asymmetric to the left, has progressed from
[id number]. Associated endplate osteophytosis is noted at this level.
Lumbar disc space heights elsewhere are preserved. Vertebral body
heights are preserved without evidence of compression fracture. No
pars defects are seen. Mild atherosclerotic vascular calcification
is noted.
IMPRESSION: 1. No evidence of acute osseous abnormality.
2. Progressive disc degeneration at L3-4.

## 2016-09-25 DEATH — deceased
